# Patient Record
Sex: Male | Born: 1955 | Race: White | Hispanic: No
Health system: Southern US, Community
[De-identification: ages and names within clinical notes are randomized; demographics above are authoritative.]

## PROBLEM LIST (undated history)

## (undated) DIAGNOSIS — I4891 Unspecified atrial fibrillation: Secondary | ICD-10-CM

## (undated) DIAGNOSIS — J9621 Acute and chronic respiratory failure with hypoxia: Secondary | ICD-10-CM

## (undated) DIAGNOSIS — I214 Non-ST elevation (NSTEMI) myocardial infarction: Secondary | ICD-10-CM

## (undated) DIAGNOSIS — N186 End stage renal disease: Secondary | ICD-10-CM

## (undated) DIAGNOSIS — I469 Cardiac arrest, cause unspecified: Secondary | ICD-10-CM

---

## 2020-10-31 ENCOUNTER — Institutional Professional Consult (permissible substitution)
Admission: RE | Admit: 2020-10-31 | Discharge: 2020-11-05 | Disposition: A | Discharge status: Still In Hospital | Payer: Medicare Other | Attending: Internal Medicine | Admitting: Internal Medicine

## 2020-10-31 ENCOUNTER — Other Ambulatory Visit (HOSPITAL_COMMUNITY): Payer: Medicare Other

## 2020-10-31 DIAGNOSIS — I4891 Unspecified atrial fibrillation: Secondary | ICD-10-CM | POA: Diagnosis present

## 2020-10-31 DIAGNOSIS — I469 Cardiac arrest, cause unspecified: Secondary | ICD-10-CM | POA: Diagnosis present

## 2020-10-31 DIAGNOSIS — N186 End stage renal disease: Secondary | ICD-10-CM

## 2020-10-31 DIAGNOSIS — Z931 Gastrostomy status: Secondary | ICD-10-CM

## 2020-10-31 DIAGNOSIS — J9690 Respiratory failure, unspecified, unspecified whether with hypoxia or hypercapnia: Secondary | ICD-10-CM

## 2020-10-31 DIAGNOSIS — I214 Non-ST elevation (NSTEMI) myocardial infarction: Secondary | ICD-10-CM | POA: Diagnosis present

## 2020-10-31 DIAGNOSIS — J9621 Acute and chronic respiratory failure with hypoxia: Secondary | ICD-10-CM | POA: Diagnosis present

## 2020-10-31 DIAGNOSIS — R112 Nausea with vomiting, unspecified: Secondary | ICD-10-CM

## 2020-10-31 HISTORY — DX: Cardiac arrest, cause unspecified: I46.9

## 2020-10-31 HISTORY — DX: Non-ST elevation (NSTEMI) myocardial infarction: I21.4

## 2020-10-31 HISTORY — DX: Acute and chronic respiratory failure with hypoxia: J96.21

## 2020-10-31 HISTORY — DX: Unspecified atrial fibrillation: I48.91

## 2020-10-31 HISTORY — DX: End stage renal disease: N18.6

## 2020-10-31 LAB — CBC
HCT: 28.7 % — ABNORMAL LOW (ref 39.0–52.0)
Hemoglobin: 8.5 g/dL — ABNORMAL LOW (ref 13.0–17.0)
MCH: 29.5 pg (ref 26.0–34.0)
MCHC: 29.6 g/dL — ABNORMAL LOW (ref 30.0–36.0)
MCV: 99.7 fL (ref 80.0–100.0)
Platelets: 174 10*3/uL (ref 150–400)
RBC: 2.88 MIL/uL — ABNORMAL LOW (ref 4.22–5.81)
RDW: 20.6 % — ABNORMAL HIGH (ref 11.5–15.5)
WBC: 4.6 10*3/uL (ref 4.0–10.5)
nRBC: 0 % (ref 0.0–0.2)

## 2020-10-31 LAB — BLOOD GAS, ARTERIAL
Acid-Base Excess: 3.4 mmol/L — ABNORMAL HIGH (ref 0.0–2.0)
Bicarbonate: 27.9 mmol/L (ref 20.0–28.0)
FIO2: 30
O2 Saturation: 98.6 %
Patient temperature: 36.6
pCO2 arterial: 45.7 mmHg (ref 32.0–48.0)
pH, Arterial: 7.401 (ref 7.350–7.450)
pO2, Arterial: 107 mmHg (ref 83.0–108.0)

## 2020-10-31 LAB — PROTIME-INR
INR: 1.2 (ref 0.8–1.2)
Prothrombin Time: 14.5 seconds (ref 11.4–15.2)

## 2020-10-31 LAB — APTT: aPTT: 49 seconds — ABNORMAL HIGH (ref 24–36)

## 2020-10-31 MED ORDER — IOHEXOL 350 MG/ML SOLN: 50.0000 mL | Freq: Once | INTRAVENOUS | Status: DC | PRN

## 2020-11-01 ENCOUNTER — Encounter: Payer: Self-pay | Admitting: Internal Medicine

## 2020-11-01 DIAGNOSIS — I469 Cardiac arrest, cause unspecified: Secondary | ICD-10-CM | POA: Diagnosis not present

## 2020-11-01 DIAGNOSIS — I4891 Unspecified atrial fibrillation: Secondary | ICD-10-CM | POA: Diagnosis present

## 2020-11-01 DIAGNOSIS — J9621 Acute and chronic respiratory failure with hypoxia: Secondary | ICD-10-CM | POA: Diagnosis present

## 2020-11-01 DIAGNOSIS — Z992 Dependence on renal dialysis: Secondary | ICD-10-CM

## 2020-11-01 DIAGNOSIS — I214 Non-ST elevation (NSTEMI) myocardial infarction: Secondary | ICD-10-CM

## 2020-11-01 DIAGNOSIS — N186 End stage renal disease: Secondary | ICD-10-CM | POA: Diagnosis not present

## 2020-11-01 LAB — PROTIME-INR
INR: 1.3 — ABNORMAL HIGH (ref 0.8–1.2)
Prothrombin Time: 15.2 seconds (ref 11.4–15.2)

## 2020-11-01 LAB — COMPREHENSIVE METABOLIC PANEL
ALT: 17 U/L (ref 0–44)
AST: 18 U/L (ref 15–41)
Albumin: 2.6 g/dL — ABNORMAL LOW (ref 3.5–5.0)
Alkaline Phosphatase: 581 U/L — ABNORMAL HIGH (ref 38–126)
Anion gap: 13 (ref 5–15)
BUN: 25 mg/dL — ABNORMAL HIGH (ref 8–23)
CO2: 28 mmol/L (ref 22–32)
Calcium: 8.9 mg/dL (ref 8.9–10.3)
Chloride: 96 mmol/L — ABNORMAL LOW (ref 98–111)
Creatinine, Ser: 3.29 mg/dL — ABNORMAL HIGH (ref 0.61–1.24)
GFR, Estimated: 20 mL/min — ABNORMAL LOW (ref >60)
Glucose, Bld: 148 mg/dL — ABNORMAL HIGH (ref 70–99)
Potassium: 4.3 mmol/L (ref 3.5–5.1)
Sodium: 137 mmol/L (ref 135–145)
Total Bilirubin: 1.2 mg/dL (ref 0.3–1.2)
Total Protein: 5.5 g/dL — ABNORMAL LOW (ref 6.5–8.1)

## 2020-11-01 LAB — CBC WITH DIFFERENTIAL/PLATELET
Abs Immature Granulocytes: 0.06 10*3/uL (ref 0.00–0.07)
Basophils Absolute: 0 10*3/uL (ref 0.0–0.1)
Basophils Relative: 1 %
Eosinophils Absolute: 0.5 10*3/uL (ref 0.0–0.5)
Eosinophils Relative: 9 %
HCT: 27.8 % — ABNORMAL LOW (ref 39.0–52.0)
Hemoglobin: 8.1 g/dL — ABNORMAL LOW (ref 13.0–17.0)
Immature Granulocytes: 1 %
Lymphocytes Relative: 22 %
Lymphs Abs: 1.2 10*3/uL (ref 0.7–4.0)
MCH: 29.6 pg (ref 26.0–34.0)
MCHC: 29.1 g/dL — ABNORMAL LOW (ref 30.0–36.0)
MCV: 101.5 fL — ABNORMAL HIGH (ref 80.0–100.0)
Monocytes Absolute: 0.5 10*3/uL (ref 0.1–1.0)
Monocytes Relative: 10 %
Neutro Abs: 3.1 10*3/uL (ref 1.7–7.7)
Neutrophils Relative %: 57 %
Platelets: 190 10*3/uL (ref 150–400)
RBC: 2.74 MIL/uL — ABNORMAL LOW (ref 4.22–5.81)
RDW: 20.8 % — ABNORMAL HIGH (ref 11.5–15.5)
WBC: 5.4 10*3/uL (ref 4.0–10.5)
nRBC: 0 % (ref 0.0–0.2)

## 2020-11-01 LAB — APTT: aPTT: 67 seconds — ABNORMAL HIGH (ref 24–36)

## 2020-11-01 LAB — PHOSPHORUS: Phosphorus: 3 mg/dL (ref 2.5–4.6)

## 2020-11-01 LAB — HEPARIN LEVEL (UNFRACTIONATED)
Heparin Unfractionated: 0.14 IU/mL — ABNORMAL LOW (ref 0.30–0.70)
Heparin Unfractionated: 0.11 IU/mL — ABNORMAL LOW (ref 0.30–0.70)

## 2020-11-01 LAB — MAGNESIUM: Magnesium: 2.2 mg/dL (ref 1.7–2.4)

## 2020-11-01 NOTE — Consult Note (Signed)
Pulmonary [place]  Date of Service: 11/01/2020  PULMONARY CRITICAL CARE CONSULT   Eric Spence  WNU:[id number]  DOB: 1956-09-06   DOA: 10/31/2020  Referring Physician: Merton Border, MD  HPI: Eric Spence is a 65 y.o. male seen for follow up of Acute on Chronic Respiratory Failure.  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  Patient was associated with having shortness of breath along with this.  The patient was evaluated in the emergency department and admitted to the hospital.  He has had a previous evaluation of cardiac catheterization in 2012 stress test in 2020.  Patient was noted to be volume overloaded with orthopnea and dyspnea.  The patient was started on dialysis.  Apparently had a follow-up stress test done and had been having some generalized weakness.  There was also some diarrhea noted during the stay.  On the 29th patient did suffer to cardiac arrest requiring CPR and they were able to achieve return of circulation.  Patient was admitted to the ICU at that point.  Patient was intubated on the ventilator requiring oxygen at 80%.  His oxygen saturations did not really show much improvement over the day.  Patient subsequently was treated with meropenem and oral vancomycin because he was felt to have significant pneumonia.  Also had low blood pressures requiring pressors.  Patient required ongoing sedation.  Presents to our hospital now for further management and weaning he had a protracted prolonged course on mechanical ventilation so had to have a tracheostomy done.  Review of Systems:  ROS performed and is unremarkable other than noted above.   Past Medical History:  Diagnosis Date  . Abnormal finding on  EKG 12/08/2018  A FIB, RTWARD AXIS, ABN ORS-T ANGLE, CONSIDER PRIMARY T WAVE ABN  . Atrial fibrillation (CMS-HCC)  . CHF (congestive heart failure) (CMS-HCC)  . Chronic kidney disease  . Coronary artery disease  . Decubitus ulcer of buttock  . Diabetes mellitus (CMS-HCC)  . H/O echocardiogram 11/12/18 EF 45-50%  . Hypertension  . Iron deficiency anemia 02/11/2019  . Myocardial infarction (CMS-HCC)  . Shortness of breath  02 3L [place] ALL TIME  . Ulcer of left foot (CMS-HCC)   Surgical History   Past Surgical History:  Procedure Laterality Date  . CARDIAC CATHETERIZATION  . HERNIA REPAIR  . PR ANASTOMOSIS,AV,ANY SITE Left 02/13/2019  Procedure: Creation of a left forearm arteriovenous fistula; Surgeon: Anastasio Auerbach, MD; Location: [facility]; Service: General Surgery  . PR INSERT TUNNELED CV CATH W/O PORT OR PUMP N/A 12/10/2018  Procedure: Placement of hemodialysis catheter.; Surgeon: Anastasio Auerbach, MD; Location: [facility]; Service: General Surgery  . PR UPPER GI ENDOSCOPY,DIAGNOSIS N/A 02/08/2019  Procedure: UGI ENDO, INCLUDE ESOPHAGUS, STOMACH, & DUODENUM &/OR JEJUNUM; DX W/WO COLLECTION SPECIMN, BY [facility] OR [place]; Surgeon: Annie Main, MD; Location: [facility]; Service: General Surgery   Social History   Social History   Social History Narrative  . Not on file   Social History   Tobacco Use  . Smoking status: Current Every Day Smoker  Packs/day: 0.25  Types: Cigarettes  . Smokeless tobacco: Never Used  . Tobacco comment: 1-2 cigarettes a day  Substance Use Topics  . Alcohol use: Never  . Drug use: Never  Family History  No family history on file.  No family status information on file.       Medications: Reviewed on Rounds  Physical Exam:  Vitals: Temperature is 97.2 pulse 98 respiratory 21 blood pressure is 102/65 saturations 100%  Ventilator Settings on assist control FiO2 30% PEEP 5 tidal volume 500  . General: Comfortable at this time . Eyes: Grossly  normal lids, irises & conjunctiva . ENT: grossly tongue is normal . Neck: no obvious mass . Cardiovascular: S1-S2 normal no gallop or rub . Respiratory: Coarse rhonchi expansion is equal . Abdomen: Soft and nontender . Skin: no rash seen on limited exam . Musculoskeletal: not rigid . Psychiatric:unable to assess . Neurologic: no seizure no involuntary movements         Labs on Admission:  Basic Metabolic Panel: Recent Labs  Lab 11/01/20 0413  NA 137  K 4.3  CL 96*  CO2 28  GLUCOSE 148*  BUN 25*  CREATININE 3.29*  CALCIUM 8.9  MG 2.2  PHOS 3.0    Recent Labs  Lab 10/31/20 1845  PHART 7.401  PCO2ART 45.7  PO2ART 107  HCO3 27.9  O2SAT 98.6    Liver Function Tests: Recent Labs  Lab 11/01/20 0413  AST 18  ALT 17  ALKPHOS 581*  BILITOT 1.2  PROT 5.5*  ALBUMIN 2.6*   No results for input(s): LIPASE, AMYLASE in the last 168 hours. No results for input(s): AMMONIA in the last 168 hours.  CBC: Recent Labs  Lab 10/31/20 1917 11/01/20 0413  WBC 4.6 5.4  NEUTROABS  --  3.1  HGB 8.5* 8.1*  HCT 28.7* 27.8*  MCV 99.7 101.5*  PLT 174 190    Cardiac Enzymes: No results for input(s): CKTOTAL, CKMB, CKMBINDEX, TROPONINI in the last 168 hours.  BNP (last 3 results) No results for input(s): BNP in the last 8760 hours.  ProBNP (last 3 results) No results for input(s): PROBNP in the last 8760 hours.   Radiological Exams on Admission: DG ABDOMEN PEG TUBE LOCATION  Result Date: 10/31/2020 CLINICAL DATA:  65 year old male with respiratory failure. Gastrostomy. EXAM: PORTABLE CHEST 1 VIEW COMPARISON:  None. FINDINGS: Tracheostomy with tip approximately 8 cm above the carina. Left IJ central venous line with tip close to the cavoatrial junction. Patchy right lung base atelectasis or infiltrate. A small right pleural effusion may be present. No pneumothorax. Mild cardiomegaly. Percutaneous gastrostomy with tip over the proximal body of the stomach. Contrast injected  through the tube noted in the duodenum. No extraluminal contrast identified. Diffuse osseous sclerotic lesions most suggestive of metastatic disease. Clinical correlation is recommended. IMPRESSION: 1. Patchy right lung base atelectasis or infiltrate. Possible small right pleural effusion. 2. Percutaneous gastrostomy with tip over the proximal body of the stomach. Electronically Signed   By: Anner Crete M.D.   On: 10/31/2020 21:25   DG Chest Port 1 View  Result Date: 10/31/2020 CLINICAL DATA:  65 year old male with respiratory failure. Gastrostomy. EXAM: PORTABLE CHEST 1 VIEW COMPARISON:  None. FINDINGS: Tracheostomy with tip approximately 8 cm above the carina. Left IJ central venous line with tip close to the cavoatrial junction. Patchy right lung base atelectasis or infiltrate. A small right pleural effusion may be present. No pneumothorax. Mild cardiomegaly. Percutaneous gastrostomy with tip over the proximal body of the stomach. Contrast injected through the tube noted in the duodenum. No extraluminal contrast identified. Diffuse osseous sclerotic lesions most suggestive of metastatic disease. Clinical correlation is recommended. IMPRESSION: 1. Patchy right lung  base atelectasis or infiltrate. Possible small right pleural effusion. 2. Percutaneous gastrostomy with tip over the proximal body of the stomach. Electronically Signed   By: Anner Crete M.D.   On: 10/31/2020 21:25    Assessment/Plan Active Problems:   Acute on chronic respiratory failure with hypoxia (HCC)   Atrial fibrillation with rapid ventricular response (HCC)   End stage renal failure on dialysis [facility])   Cardiac arrest ([place])   Non-STEMI (non-ST elevated myocardial infarction) ([place])   1. Acute on chronic respiratory failure hypoxia [place]auer is on assist control mode on 30% FiO2 with a PEEP of 5 respiratory therapy will check the RSB I mechanics and try to wean. 2. Atrial fibrillation rapid ventricular response patient  is now rate controlled has been on amiodarone we will continue with medical management. 3. End-stage renal failure on hemodialysis will need follow-up with nephrology for hemodialysis. 4. Cardiac arrest patient suffered multiple cardiac arrest at the other facility the plan is going to be to continue to monitor on telemetry. 5. Non-STEMI supportive care will cardiology involvement may be considered  I have personally seen and evaluated the patient, evaluated laboratory and imaging results, formulated the assessment and plan and placed orders. The Patient requires high complexity decision making with multiple systems involvement.  Case was discussed on Rounds with the Respiratory Therapy Director and the Respiratory staff Time Spent 32minutes  Saadat A Khan, MD [facility] Pulmonary Critical Care Medicine Sleep Medicine

## 2020-11-02 DIAGNOSIS — I469 Cardiac arrest, cause unspecified: Secondary | ICD-10-CM | POA: Diagnosis not present

## 2020-11-02 DIAGNOSIS — N186 End stage renal disease: Secondary | ICD-10-CM | POA: Diagnosis not present

## 2020-11-02 DIAGNOSIS — I4891 Unspecified atrial fibrillation: Secondary | ICD-10-CM | POA: Diagnosis not present

## 2020-11-02 DIAGNOSIS — J9621 Acute and chronic respiratory failure with hypoxia: Secondary | ICD-10-CM | POA: Diagnosis not present

## 2020-11-02 LAB — HEPATITIS B CORE ANTIBODY, TOTAL: Hep B Core Total Ab: NONREACTIVE

## 2020-11-02 LAB — HEPARIN LEVEL (UNFRACTIONATED)
Heparin Unfractionated: 0.28 IU/mL — ABNORMAL LOW (ref 0.30–0.70)
Heparin Unfractionated: <0.1 IU/mL — ABNORMAL LOW (ref 0.30–0.70)
Heparin Unfractionated: 0.39 IU/mL (ref 0.30–0.70)

## 2020-11-02 LAB — HEPATITIS B SURFACE ANTIGEN: Hepatitis B Surface Ag: NONREACTIVE

## 2020-11-02 NOTE — Progress Notes (Signed)
Pulmonary Critical Care Medicine [place]   PULMONARY CRITICAL CARE SERVICE  PROGRESS NOTE  Date of Service: 11/02/2020  Eric Spence  IOE:[id number]  DOB: 1956/04/09   DOA: 10/31/2020  Referring Physician: Merton Border, MD  HPI: Eric Spence is a 65 y.o. male seen for follow up of Acute on Chronic Respiratory Failure.  Patient currently is on full support on the ventilator.  Supposed to do pressure support weaning today.  Medications: Reviewed on Rounds  Physical Exam:  Vitals: Temperature is 97.8 pulse 170 respiratory 25 blood pressure is 123/64 saturations 97%  Ventilator Settings currently on assist control FiO2 is 30% tidal volume 500 PEEP of 5  . General: Comfortable at this time . Eyes: Grossly normal lids, irises & conjunctiva . ENT: grossly tongue is normal . Neck: no obvious mass . Cardiovascular: S1 S2 normal no gallop . Respiratory: Scattered rhonchi expansion is equal . Abdomen: soft . Skin: no rash seen on limited exam . Musculoskeletal: not rigid . Psychiatric:unable to assess . Neurologic: no seizure no involuntary movements         Lab Data:   Basic Metabolic Panel: Recent Labs  Lab 11/01/20 0413  NA 137  K 4.3  CL 96*  CO2 28  GLUCOSE 148*  BUN 25*  CREATININE 3.29*  CALCIUM 8.9  MG 2.2  PHOS 3.0    ABG: Recent Labs  Lab 10/31/20 1845  PHART 7.401  PCO2ART 45.7  PO2ART 107  HCO3 27.9  O2SAT 98.6    Liver Function Tests: Recent Labs  Lab 11/01/20 0413  AST 18  ALT 17  ALKPHOS 581*  BILITOT 1.2  PROT 5.5*  ALBUMIN 2.6*   No results for input(s): LIPASE, AMYLASE in the last 168 hours. No results for input(s): AMMONIA in the last 168 hours.  CBC: Recent Labs  Lab 10/31/20 1917 11/01/20 0413  WBC 4.6 5.4  NEUTROABS  --  3.1  HGB 8.5* 8.1*  HCT 28.7* 27.8*  MCV 99.7 101.5*  PLT 174 190    Cardiac Enzymes: No results for input(s): CKTOTAL, CKMB, CKMBINDEX, TROPONINI in the last 168  hours.  BNP (last 3 results) No results for input(s): BNP in the last 8760 hours.  ProBNP (last 3 results) No results for input(s): PROBNP in the last 8760 hours.  Radiological Exams: DG ABDOMEN PEG TUBE LOCATION  Result Date: 10/31/2020 CLINICAL DATA:  65 year old male with respiratory failure. Gastrostomy. EXAM: PORTABLE CHEST 1 VIEW COMPARISON:  None. FINDINGS: Tracheostomy with tip approximately 8 cm above the carina. Left IJ central venous line with tip close to the cavoatrial junction. Patchy right lung base atelectasis or infiltrate. A small right pleural effusion may be present. No pneumothorax. Mild cardiomegaly. Percutaneous gastrostomy with tip over the proximal body of the stomach. Contrast injected through the tube noted in the duodenum. No extraluminal contrast identified. Diffuse osseous sclerotic lesions most suggestive of metastatic disease. Clinical correlation is recommended. IMPRESSION: 1. Patchy right lung base atelectasis or infiltrate. Possible small right pleural effusion. 2. Percutaneous gastrostomy with tip over the proximal body of the stomach. Electronically Signed   By: Anner Crete M.D.   On: 10/31/2020 21:25   DG Chest Port 1 View  Result Date: 10/31/2020 CLINICAL DATA:  65 year old male with respiratory failure. Gastrostomy. EXAM: PORTABLE CHEST 1 VIEW COMPARISON:  None. FINDINGS: Tracheostomy with tip approximately 8 cm above the carina. Left IJ central venous line with tip close to the cavoatrial junction. Patchy right lung base atelectasis  or infiltrate. A small right pleural effusion may be present. No pneumothorax. Mild cardiomegaly. Percutaneous gastrostomy with tip over the proximal body of the stomach. Contrast injected through the tube noted in the duodenum. No extraluminal contrast identified. Diffuse osseous sclerotic lesions most suggestive of metastatic disease. Clinical correlation is recommended. IMPRESSION: 1. Patchy right lung base atelectasis or  infiltrate. Possible small right pleural effusion. 2. Percutaneous gastrostomy with tip over the proximal body of the stomach. Electronically Signed   By: Anner Crete M.D.   On: 10/31/2020 21:25    Assessment/Plan Active Problems:   Acute on chronic respiratory failure with hypoxia (HCC)   Atrial fibrillation with rapid ventricular response (HCC)   End stage renal failure on dialysis [facility])   Cardiac arrest ([place])   Non-STEMI (non-ST elevated myocardial infarction) ([place])   1. Acute on chronic respiratory failure hypoxia patient is on full support right now to goal on pressure support 2. Atrial fibrillation rate is controlled 3. End-stage renal failure on dialysis 4. Cardiac arrest rhythm stable 5. Non-STEMI no change we will continue to monitor   I have personally seen and evaluated the patient, evaluated laboratory and imaging results, formulated the assessment and plan and placed orders. The Patient requires high complexity decision making with multiple systems involvement.  Rounds were done with the Respiratory Therapy Director and Staff therapists and discussed with nursing staff also.  [name], MD [facility] Pulmonary Critical Care Medicine Sleep Medicine

## 2020-11-03 DIAGNOSIS — I4891 Unspecified atrial fibrillation: Secondary | ICD-10-CM | POA: Diagnosis not present

## 2020-11-03 DIAGNOSIS — N186 End stage renal disease: Secondary | ICD-10-CM | POA: Diagnosis not present

## 2020-11-03 DIAGNOSIS — J9621 Acute and chronic respiratory failure with hypoxia: Secondary | ICD-10-CM | POA: Diagnosis not present

## 2020-11-03 DIAGNOSIS — I469 Cardiac arrest, cause unspecified: Secondary | ICD-10-CM | POA: Diagnosis not present

## 2020-11-03 LAB — HEPARIN LEVEL (UNFRACTIONATED)
Heparin Unfractionated: 0.11 IU/mL — ABNORMAL LOW (ref 0.30–0.70)
Heparin Unfractionated: 0.27 IU/mL — ABNORMAL LOW (ref 0.30–0.70)
Heparin Unfractionated: 0.23 IU/mL — ABNORMAL LOW (ref 0.30–0.70)

## 2020-11-03 LAB — HEPATITIS B SURFACE ANTIBODY, QUANTITATIVE: Hep B S AB Quant (Post): 5.1 m[IU]/mL — ABNORMAL LOW (ref >9.9)

## 2020-11-03 NOTE — Progress Notes (Signed)
Pulmonary Critical Care Medicine [place]   PULMONARY CRITICAL CARE SERVICE  PROGRESS NOTE  Date of Service: 11/03/2020  Eric Spence  GYJ:[id number]  DOB: 1956/09/01   DOA: 10/31/2020  Referring Physician: Merton Border, MD  HPI: Eric Spence is a 64 y.o. male seen for follow up of Acute on Chronic Respiratory Failure.  Patient currently is on pressure support has been on pressure of 12/5  Medications: Reviewed on Rounds  Physical Exam:  Vitals: Temperature is 98.9 pulse 104 respiratory rate is 20 blood pressure is 144/74 saturations 98%  Ventilator Settings on pressure support currently on 12/5  . General: Comfortable at this time . Eyes: Grossly normal lids, irises & conjunctiva . ENT: grossly tongue is normal . Neck: no obvious mass . Cardiovascular: S1 S2 normal no gallop . Respiratory: No rhonchi very coarse percent . Abdomen: soft . Skin: no rash seen on limited exam . Musculoskeletal: not rigid . Psychiatric:unable to assess . Neurologic: no seizure no involuntary movements         Lab Data:   Basic Metabolic Panel: Recent Labs  Lab 11/01/20 0413  NA 137  K 4.3  CL 96*  CO2 28  GLUCOSE 148*  BUN 25*  CREATININE 3.29*  CALCIUM 8.9  MG 2.2  PHOS 3.0    ABG: Recent Labs  Lab 10/31/20 1845  PHART 7.401  PCO2ART 45.7  PO2ART 107  HCO3 27.9  O2SAT 98.6    Liver Function Tests: Recent Labs  Lab 11/01/20 0413  AST 18  ALT 17  ALKPHOS 581*  BILITOT 1.2  PROT 5.5*  ALBUMIN 2.6*   No results for input(s): LIPASE, AMYLASE in the last 168 hours. No results for input(s): AMMONIA in the last 168 hours.  CBC: Recent Labs  Lab 10/31/20 1917 11/01/20 0413  WBC 4.6 5.4  NEUTROABS  --  3.1  HGB 8.5* 8.1*  HCT 28.7* 27.8*  MCV 99.7 101.5*  PLT 174 190    Cardiac Enzymes: No results for input(s): CKTOTAL, CKMB, CKMBINDEX, TROPONINI in the last 168 hours.  BNP (last 3 results) No results for input(s): BNP in the  last 8760 hours.  ProBNP (last 3 results) No results for input(s): PROBNP in the last 8760 hours.  Radiological Exams: No results found.  Assessment/Plan Active Problems:   Acute on chronic respiratory failure with hypoxia (HCC)   Atrial fibrillation with rapid ventricular response (HCC)   End stage renal failure on dialysis [facility])   Cardiac arrest ([place])   Non-STEMI (non-ST elevated myocardial infarction) ([place])   1. Acute on chronic respiratory failure hypoxia we will continue with the pressure support wean with goal is for 12 hours today 2. Atrial fibrillation rate is controlled 3. End-stage renal failure on hemodialysis 4. Cardiac arrest rhythm stable 5. Non-STEMI no change   I have personally seen and evaluated the patient, evaluated laboratory and imaging results, formulated the assessment and plan and placed orders. The Patient requires high complexity decision making with multiple systems involvement.  Rounds were done with the Respiratory Therapy Director and Staff therapists and discussed with nursing staff also.  [name], MD [facility] Pulmonary Critical Care Medicine Sleep Medicine

## 2020-11-04 ENCOUNTER — Other Ambulatory Visit (HOSPITAL_COMMUNITY): Payer: Medicare Other

## 2020-11-04 DIAGNOSIS — N186 End stage renal disease: Secondary | ICD-10-CM | POA: Diagnosis not present

## 2020-11-04 DIAGNOSIS — I469 Cardiac arrest, cause unspecified: Secondary | ICD-10-CM | POA: Diagnosis not present

## 2020-11-04 DIAGNOSIS — J9621 Acute and chronic respiratory failure with hypoxia: Secondary | ICD-10-CM | POA: Diagnosis not present

## 2020-11-04 DIAGNOSIS — I4891 Unspecified atrial fibrillation: Secondary | ICD-10-CM | POA: Diagnosis not present

## 2020-11-04 LAB — RENAL FUNCTION PANEL
Albumin: 2.4 g/dL — ABNORMAL LOW (ref 3.5–5.0)
Anion gap: 11 (ref 5–15)
BUN: 56 mg/dL — ABNORMAL HIGH (ref 8–23)
CO2: 26 mmol/L (ref 22–32)
Calcium: 8.9 mg/dL (ref 8.9–10.3)
Chloride: 99 mmol/L (ref 98–111)
Creatinine, Ser: 3.99 mg/dL — ABNORMAL HIGH (ref 0.61–1.24)
GFR, Estimated: 16 mL/min — ABNORMAL LOW (ref >60)
Glucose, Bld: 136 mg/dL — ABNORMAL HIGH (ref 70–99)
Phosphorus: 2.4 mg/dL — ABNORMAL LOW (ref 2.5–4.6)
Potassium: 3.9 mmol/L (ref 3.5–5.1)
Sodium: 136 mmol/L (ref 135–145)

## 2020-11-04 LAB — CBC
HCT: 26.1 % — ABNORMAL LOW (ref 39.0–52.0)
Hemoglobin: 8 g/dL — ABNORMAL LOW (ref 13.0–17.0)
MCH: 30.8 pg (ref 26.0–34.0)
MCHC: 30.7 g/dL (ref 30.0–36.0)
MCV: 100.4 fL — ABNORMAL HIGH (ref 80.0–100.0)
Platelets: 170 10*3/uL (ref 150–400)
RBC: 2.6 MIL/uL — ABNORMAL LOW (ref 4.22–5.81)
RDW: 20.6 % — ABNORMAL HIGH (ref 11.5–15.5)
WBC: 7.7 10*3/uL (ref 4.0–10.5)
nRBC: 0.3 % — ABNORMAL HIGH (ref 0.0–0.2)

## 2020-11-04 LAB — HEPARIN LEVEL (UNFRACTIONATED)
Heparin Unfractionated: 0.39 IU/mL (ref 0.30–0.70)
Heparin Unfractionated: 0.25 IU/mL — ABNORMAL LOW (ref 0.30–0.70)
Heparin Unfractionated: 0.25 IU/mL — ABNORMAL LOW (ref 0.30–0.70)
Heparin Unfractionated: <0.1 IU/mL — ABNORMAL LOW (ref 0.30–0.70)

## 2020-11-04 NOTE — Consult Note (Signed)
[facility] CONSULT NOTE    Date: 11/04/2020                  Patient Name:  Eric Spence  MRN: [id number]  DOB: 1956/07/25  Age / Sex: 65 y.o., male         PCP: Patient, No Pcp Per                 Service Requesting Consult:  Hospitalist                 Reason for Consult:  Evaluation and management of ESRD            History of Present Illness: Patient is a 65 y.o. male with a PMHx of recent acute cardiopulmonary arrest on 09/27/2020, NSTEMI, severe sepsis from recurrent pneumonia, atrial fibrillation, coronary artery disease with history of MI, diabetes mellitus type 2, anemia of chronic kidney disease, secondary hyperparathyroidism, ESRD with left upper extremity AV fistula, who was admitted to [facility] on 10/31/2020 for ongoing treatment.  He initially presented to outside hospital with complaints of shortness of breath and left-sided chest pain.  He had a stress test performed to further evaluate this.  Subsequent to this on 09/28/2020 patient suffered to cardiopulmonary arrest events.  He has acute on chronic respiratory failure.  Currently has a tracheostomy in place and maintained on the ventilator.  We were consulted for evaluation management of ESRD.  He has a left upper extremity AV fistula in place.  He did undergo hemodialysis treatment on Wednesday.  Patient has associated anemia of chronic kidney disease with a hemoglobin of 8.0.   Medications:  Current medications:  Heparin drip, Retacrit with 10,000 units on Mondays and Fridays, amiodarone 400 mg twice daily, aspirin 81 mg daily, atorvastatin 20 mg daily, diltiazem 60 mg every 8 hours, Fosrenol 750 mg 3 times daily, melatonin 3 mg nightly, Protonix 40 mg daily, scopolamine patch every 72 hours, multivitamin 1 tablet daily   Allergies: Bactrim, codeine, Zithromax, Lipitor   Past Medical History: Past Medical History:  Diagnosis Date  . Acute on chronic respiratory failure with hypoxia ([place])    . Atrial fibrillation with rapid ventricular response ([place])   . Cardiac arrest ([place])   . End stage renal failure on dialysis ([place])   . Non-STEMI (non-ST elevated myocardial infarction) [facility])      Past Surgical History: Left upper extremity AV fistula Tracheostomy placement   Family History: Unable to obtain from the patient as he is currently on the ventilator.  Social History: Unable to obtain from the patient as he is currently on the ventilator.  Review of Systems: Unable to obtain from the patient as he is currently on the ventilator.  Vital Signs: Temperature 98 pulse 102 respirations 20 blood pressure 136/68  Weight trends: There were no vitals filed for this visit.  Physical Exam: Physical Exam: General:  No acute distress  Head:  Normocephalic, atraumatic. Moist oral mucosal membranes  Eyes:  Anicteric  Neck:  Supple  Lungs:   Scattered rhonchi, vent assisted  Heart:  S1S2 irregular  Abdomen:   Soft, nontender, bowel sounds present  Extremities:  1+ peripheral edema.  Neurologic:  Awake, alert, following commands  Skin:  No lesions  Access:  Left upper extremity AV fistula with palpable thrill    Lab results: Basic Metabolic Panel: Recent Labs  Lab 11/01/20 0413 11/04/20 0700  NA 137 136  K 4.3 3.9  CL 96* 99  CO2 28 26  GLUCOSE 148* 136*  BUN 25* 56*  CREATININE 3.29* 3.99*  CALCIUM 8.9 8.9  MG 2.2  --   PHOS 3.0 2.4*    Liver Function Tests: Recent Labs  Lab 11/01/20 0413 11/04/20 0700  AST 18  --   ALT 17  --   ALKPHOS 581*  --   BILITOT 1.2  --   PROT 5.5*  --   ALBUMIN 2.6* 2.4*   No results for input(s): LIPASE, AMYLASE in the last 168 hours. No results for input(s): AMMONIA in the last 168 hours.  CBC: Recent Labs  Lab 10/31/20 1917 11/01/20 0413 11/04/20 0700  WBC 4.6 5.4 7.7  NEUTROABS  --  3.1  --   HGB 8.5* 8.1* 8.0*  HCT 28.7* 27.8* 26.1*  MCV 99.7 101.5* 100.4*  PLT 174 190 170    Cardiac Enzymes: No  results for input(s): CKTOTAL, CKMB, CKMBINDEX, TROPONINI in the last 168 hours.  BNP: Invalid input(s): POCBNP  CBG: No results for input(s): GLUCAP in the last 168 hours.  Microbiology: No results found for this or any previous visit.  Coagulation Studies: No results for input(s): LABPROT, INR in the last 72 hours.  Urinalysis: No results for input(s): COLORURINE, LABSPEC, PHURINE, GLUCOSEU, HGBUR, BILIRUBINUR, KETONESUR, PROTEINUR, UROBILINOGEN, NITRITE, LEUKOCYTESUR in the last 72 hours.  Invalid input(s): APPERANCEUR    Imaging:  No results found.   Assessment & Plan: Pt is a 65 y.o. 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  1.  ESRD on HD MWF.  Patient due for hemodialysis treatment today.  We will prepare orders.  Use left upper extremity fistula for dialysis treatment.  2.  Acute respiratory failure.  Still on the ventilator.  Weaning as per pulmonary/critical care.  3.  Anemia of chronic kidney disease.  Maintain the patient on Retacrit 10,000 units IV on Mondays and Fridays.  4.  Secondary hyperparathyroidism.  Currently on Fosrenol.  Continue to monitor phosphorus and calcium periodically.  5.  Thanks of consultation.

## 2020-11-04 NOTE — Progress Notes (Signed)
Pulmonary Critical Care Medicine [place]   PULMONARY CRITICAL CARE SERVICE  PROGRESS NOTE  Date of Service: 11/04/2020  Eric Spence  VZC:[id number]  DOB: 1955-12-02   DOA: 10/31/2020  Referring Physician: Merton Border, MD  HPI: Eric Spence is a 65 y.o. male seen for follow up of Acute on Chronic Respiratory Failure.  Patient currently is on pressure support has been on 30% FiO2 currently goal of 16 hours  Medications: Reviewed on Rounds  Physical Exam:  Vitals: Temp 98.0 pulse 102 respiratory rate is 28 blood pressure is 136/68 saturations 99%  Ventilator Settings on pressure support 12/5  . General: Comfortable at this time . Eyes: Grossly normal lids, irises & conjunctiva . ENT: grossly tongue is normal . Neck: no obvious mass . Cardiovascular: S1 S2 normal no gallop . Respiratory: Scattered rhonchi expansion equal . Abdomen: soft . Skin: no rash seen on limited exam . Musculoskeletal: not rigid . Psychiatric:unable to assess . Neurologic: no seizure no involuntary movements         Lab Data:   Basic Metabolic Panel: Recent Labs  Lab 11/01/20 0413 11/04/20 0700  NA 137 136  K 4.3 3.9  CL 96* 99  CO2 28 26  GLUCOSE 148* 136*  BUN 25* 56*  CREATININE 3.29* 3.99*  CALCIUM 8.9 8.9  MG 2.2  --   PHOS 3.0 2.4*    ABG: Recent Labs  Lab 10/31/20 1845  PHART 7.401  PCO2ART 45.7  PO2ART 107  HCO3 27.9  O2SAT 98.6    Liver Function Tests: Recent Labs  Lab 11/01/20 0413 11/04/20 0700  AST 18  --   ALT 17  --   ALKPHOS 581*  --   BILITOT 1.2  --   PROT 5.5*  --   ALBUMIN 2.6* 2.4*   No results for input(s): LIPASE, AMYLASE in the last 168 hours. No results for input(s): AMMONIA in the last 168 hours.  CBC: Recent Labs  Lab 10/31/20 1917 11/01/20 0413 11/04/20 0700  WBC 4.6 5.4 7.7  NEUTROABS  --  3.1  --   HGB 8.5* 8.1* 8.0*  HCT 28.7* 27.8* 26.1*  MCV 99.7 101.5* 100.4*  PLT 174 190 170    Cardiac  Enzymes: No results for input(s): CKTOTAL, CKMB, CKMBINDEX, TROPONINI in the last 168 hours.  BNP (last 3 results) No results for input(s): BNP in the last 8760 hours.  ProBNP (last 3 results) No results for input(s): PROBNP in the last 8760 hours.  Radiological Exams: No results found.  Assessment/Plan Active Problems:   Acute on chronic respiratory failure with hypoxia (HCC)   Atrial fibrillation with rapid ventricular response (HCC)   End stage renal failure on dialysis [facility])   Cardiac arrest ([place])   Non-STEMI (non-ST elevated myocardial infarction) ([place])   1. Acute on chronic respiratory failure hypoxia we will continue with the wean on pressure support goal of 16 hours 2. Atrial fibrillation rate controlled 3. End-stage renal failure on dialysis 4. Cardiac arrest rhythm stable 5. Non-STEMI no change we will continue to monitor closely   I have personally seen and evaluated the patient, evaluated laboratory and imaging results, formulated the assessment and plan and placed orders. The Patient requires high complexity decision making with multiple systems involvement.  Rounds were done with the Respiratory Therapy Director and Staff therapists and discussed with nursing staff also.  [name], MD [facility] Pulmonary Critical Care Medicine Sleep Medicine

## 2020-11-05 ENCOUNTER — Other Ambulatory Visit (HOSPITAL_COMMUNITY): Payer: Medicare Other

## 2020-11-05 DIAGNOSIS — N186 End stage renal disease: Secondary | ICD-10-CM | POA: Diagnosis not present

## 2020-11-05 DIAGNOSIS — J9621 Acute and chronic respiratory failure with hypoxia: Secondary | ICD-10-CM | POA: Diagnosis not present

## 2020-11-05 DIAGNOSIS — I469 Cardiac arrest, cause unspecified: Secondary | ICD-10-CM | POA: Diagnosis not present

## 2020-11-05 DIAGNOSIS — I4891 Unspecified atrial fibrillation: Secondary | ICD-10-CM | POA: Diagnosis not present

## 2020-11-05 LAB — COMPREHENSIVE METABOLIC PANEL
ALT: 13 U/L (ref 0–44)
AST: 17 U/L (ref 15–41)
Albumin: 2.1 g/dL — ABNORMAL LOW (ref 3.5–5.0)
Alkaline Phosphatase: 461 U/L — ABNORMAL HIGH (ref 38–126)
Anion gap: 12 (ref 5–15)
BUN: 45 mg/dL — ABNORMAL HIGH (ref 8–23)
CO2: 25 mmol/L (ref 22–32)
Calcium: 8.5 mg/dL — ABNORMAL LOW (ref 8.9–10.3)
Chloride: 102 mmol/L (ref 98–111)
Creatinine, Ser: 3 mg/dL — ABNORMAL HIGH (ref 0.61–1.24)
GFR, Estimated: 22 mL/min — ABNORMAL LOW (ref >60)
Glucose, Bld: 159 mg/dL — ABNORMAL HIGH (ref 70–99)
Potassium: 3.7 mmol/L (ref 3.5–5.1)
Sodium: 139 mmol/L (ref 135–145)
Total Bilirubin: 1 mg/dL (ref 0.3–1.2)
Total Protein: 5 g/dL — ABNORMAL LOW (ref 6.5–8.1)

## 2020-11-05 LAB — HEPARIN LEVEL (UNFRACTIONATED): Heparin Unfractionated: 0.59 IU/mL (ref 0.30–0.70)

## 2020-11-05 NOTE — Progress Notes (Signed)
Pulmonary Critical Care Medicine [place]   PULMONARY CRITICAL CARE SERVICE  PROGRESS NOTE  Date of Service: 11/05/2020  AIVEN KAMPE  TIW:[id number]  DOB: 03-25-1956   DOA: 10/31/2020  Referring Physician: Merton Border, MD  HPI: Eric Spence is a 65 y.o. male seen for follow up of Acute on Chronic Respiratory Failure.  Patient currently is on the ventilator and full support had some issues with vomiting overnight  Medications: Reviewed on Rounds  Physical Exam:  Vitals: Temperature is 98.4 pulse 120 respiratory 34 blood pressure is 147/67 saturations 96%  Ventilator Settings on assist control FiO2 is 30% tidal volume 500 with a PEEP of 5  . General: Comfortable at this time . Eyes: Grossly normal lids, irises & conjunctiva . ENT: grossly tongue is normal . Neck: no obvious mass . Cardiovascular: S1 S2 normal no gallop . Respiratory: No rhonchi coarse breath sound . Abdomen: soft . Skin: no rash seen on limited exam . Musculoskeletal: not rigid . Psychiatric:unable to assess . Neurologic: no seizure no involuntary movements         Lab Data:   Basic Metabolic Panel: Recent Labs  Lab 11/01/20 0413 11/04/20 0700  NA 137 136  K 4.3 3.9  CL 96* 99  CO2 28 26  GLUCOSE 148* 136*  BUN 25* 56*  CREATININE 3.29* 3.99*  CALCIUM 8.9 8.9  MG 2.2  --   PHOS 3.0 2.4*    ABG: Recent Labs  Lab 10/31/20 1845  PHART 7.401  PCO2ART 45.7  PO2ART 107  HCO3 27.9  O2SAT 98.6    Liver Function Tests: Recent Labs  Lab 11/01/20 0413 11/04/20 0700  AST 18  --   ALT 17  --   ALKPHOS 581*  --   BILITOT 1.2  --   PROT 5.5*  --   ALBUMIN 2.6* 2.4*   No results for input(s): LIPASE, AMYLASE in the last 168 hours. No results for input(s): AMMONIA in the last 168 hours.  CBC: Recent Labs  Lab 10/31/20 1917 11/01/20 0413 11/04/20 0700  WBC 4.6 5.4 7.7  NEUTROABS  --  3.1  --   HGB 8.5* 8.1* 8.0*  HCT 28.7* 27.8* 26.1*  MCV 99.7 101.5*  100.4*  PLT 174 190 170    Cardiac Enzymes: No results for input(s): CKTOTAL, CKMB, CKMBINDEX, TROPONINI in the last 168 hours.  BNP (last 3 results) No results for input(s): BNP in the last 8760 hours.  ProBNP (last 3 results) No results for input(s): PROBNP in the last 8760 hours.  Radiological Exams: DG Abd 1 View  Result Date: 11/04/2020 CLINICAL DATA:  Nausea and vomiting EXAM: ABDOMEN - 1 VIEW COMPARISON:  10/31/2020 FINDINGS: Two supine frontal views of the abdomen and pelvis are obtained, limited by portable technique and body habitus. There is diffuse gaseous distension of small bowel, nonspecific. Oral contrast is seen throughout the colon to the level of the rectum. There are no masses or abnormal calcifications identified. Diffuse sclerosis of the visualized bony structures most pronounced within the spine, pelvis, and bilateral hips, stable. 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 IMPRESSION: 1. Nonspecific gaseous distention of the small bowel, which could reflect obstruction or ileus. Electronically Signed   By: Randa Ngo M.D.   On: 11/04/2020 16:55    Assessment/Plan Active Problems:   Acute on chronic respiratory failure with hypoxia (HCC)   Atrial fibrillation with rapid ventricular  response ([place])   End stage renal failure on dialysis [facility])   Cardiac arrest ([place])   Non-STEMI (non-ST elevated myocardial infarction) ([place])   1. Acute on chronic respiratory failure hypoxia we will continue with assist control titrate oxygen continue pulmonary toilet. 2. Atrial fibrillation rate is controlled at this time 3. End-stage renal failure on hemodialysis 4. Cardiac arrest rhythm stable 5. Non-STEMI no change   I have personally seen and evaluated the patient, evaluated laboratory and imaging results, formulated the assessment and plan and placed orders. The Patient requires high complexity  decision making with multiple systems involvement.  Rounds were done with the Respiratory Therapy Director and Staff therapists and discussed with nursing staff also.  [name], MD [facility] Pulmonary Critical Care Medicine Sleep Medicine

## 2020-11-06 ENCOUNTER — Observation Stay (HOSPITAL_COMMUNITY)
Admission: EM | Admit: 2020-11-06 | Discharge: 2020-11-06 | Disposition: A | Discharge status: Home / Self Care | Payer: No Typology Code available for payment source | Attending: Internal Medicine | Admitting: Internal Medicine

## 2020-11-06 ENCOUNTER — Emergency Department (HOSPITAL_COMMUNITY): Payer: No Typology Code available for payment source

## 2020-11-06 ENCOUNTER — Inpatient Hospital Stay
Admission: RE | Admit: 2020-11-06 | Discharge: 2020-12-30 | Disposition: E | Payer: Medicare Other | Attending: Internal Medicine | Admitting: Internal Medicine

## 2020-11-06 DIAGNOSIS — J189 Pneumonia, unspecified organism: Secondary | ICD-10-CM | POA: Diagnosis not present

## 2020-11-06 DIAGNOSIS — J9610 Chronic respiratory failure, unspecified whether with hypoxia or hypercapnia: Secondary | ICD-10-CM | POA: Diagnosis not present

## 2020-11-06 DIAGNOSIS — E1122 Type 2 diabetes mellitus with diabetic chronic kidney disease: Secondary | ICD-10-CM | POA: Insufficient documentation

## 2020-11-06 DIAGNOSIS — K55059 Acute (reversible) ischemia of intestine, part and extent unspecified: Secondary | ICD-10-CM | POA: Diagnosis present

## 2020-11-06 DIAGNOSIS — I959 Hypotension, unspecified: Principal | ICD-10-CM | POA: Diagnosis present

## 2020-11-06 DIAGNOSIS — N186 End stage renal disease: Secondary | ICD-10-CM

## 2020-11-06 DIAGNOSIS — J9621 Acute and chronic respiratory failure with hypoxia: Secondary | ICD-10-CM | POA: Diagnosis present

## 2020-11-06 DIAGNOSIS — G370 Diffuse sclerosis of central nervous system: Secondary | ICD-10-CM | POA: Diagnosis not present

## 2020-11-06 DIAGNOSIS — Z992 Dependence on renal dialysis: Secondary | ICD-10-CM

## 2020-11-06 DIAGNOSIS — I214 Non-ST elevation (NSTEMI) myocardial infarction: Secondary | ICD-10-CM | POA: Diagnosis present

## 2020-11-06 DIAGNOSIS — Z79899 Other long term (current) drug therapy: Secondary | ICD-10-CM | POA: Insufficient documentation

## 2020-11-06 DIAGNOSIS — I4891 Unspecified atrial fibrillation: Secondary | ICD-10-CM | POA: Insufficient documentation

## 2020-11-06 DIAGNOSIS — I2510 Atherosclerotic heart disease of native coronary artery without angina pectoris: Secondary | ICD-10-CM | POA: Insufficient documentation

## 2020-11-06 DIAGNOSIS — Z20822 Contact with and (suspected) exposure to covid-19: Secondary | ICD-10-CM | POA: Diagnosis not present

## 2020-11-06 DIAGNOSIS — Z794 Long term (current) use of insulin: Secondary | ICD-10-CM | POA: Insufficient documentation

## 2020-11-06 DIAGNOSIS — D649 Anemia, unspecified: Secondary | ICD-10-CM | POA: Diagnosis not present

## 2020-11-06 DIAGNOSIS — J811 Chronic pulmonary edema: Secondary | ICD-10-CM

## 2020-11-06 DIAGNOSIS — I252 Old myocardial infarction: Secondary | ICD-10-CM | POA: Diagnosis not present

## 2020-11-06 DIAGNOSIS — C61 Malignant neoplasm of prostate: Secondary | ICD-10-CM

## 2020-11-06 DIAGNOSIS — Z7982 Long term (current) use of aspirin: Secondary | ICD-10-CM | POA: Diagnosis not present

## 2020-11-06 DIAGNOSIS — I469 Cardiac arrest, cause unspecified: Secondary | ICD-10-CM | POA: Diagnosis present

## 2020-11-06 LAB — CBC WITH DIFFERENTIAL/PLATELET
Abs Immature Granulocytes: 0.01 10*3/uL (ref 0.00–0.07)
Basophils Absolute: 0 10*3/uL (ref 0.0–0.1)
Basophils Relative: 0 %
Eosinophils Absolute: 0 10*3/uL (ref 0.0–0.5)
Eosinophils Relative: 0 %
HCT: 27.9 % — ABNORMAL LOW (ref 39.0–52.0)
Hemoglobin: 7.9 g/dL — ABNORMAL LOW (ref 13.0–17.0)
Immature Granulocytes: 0 %
Lymphocytes Relative: 14 %
Lymphs Abs: 0.8 10*3/uL (ref 0.7–4.0)
MCH: 29.4 pg (ref 26.0–34.0)
MCHC: 28.3 g/dL — ABNORMAL LOW (ref 30.0–36.0)
MCV: 103.7 fL — ABNORMAL HIGH (ref 80.0–100.0)
Monocytes Absolute: 1.1 10*3/uL — ABNORMAL HIGH (ref 0.1–1.0)
Monocytes Relative: 19 %
Neutro Abs: 3.8 10*3/uL (ref 1.7–7.7)
Neutrophils Relative %: 67 %
Platelets: 181 10*3/uL (ref 150–400)
RBC: 2.69 MIL/uL — ABNORMAL LOW (ref 4.22–5.81)
RDW: 20.7 % — ABNORMAL HIGH (ref 11.5–15.5)
WBC: 5.7 10*3/uL (ref 4.0–10.5)
nRBC: 0 % (ref 0.0–0.2)

## 2020-11-06 LAB — CBC
HCT: 25.3 % — ABNORMAL LOW (ref 39.0–52.0)
Hemoglobin: 7.3 g/dL — ABNORMAL LOW (ref 13.0–17.0)
MCH: 30 pg (ref 26.0–34.0)
MCHC: 28.9 g/dL — ABNORMAL LOW (ref 30.0–36.0)
MCV: 104.1 fL — ABNORMAL HIGH (ref 80.0–100.0)
Platelets: 174 10*3/uL (ref 150–400)
RBC: 2.43 MIL/uL — ABNORMAL LOW (ref 4.22–5.81)
RDW: 20.5 % — ABNORMAL HIGH (ref 11.5–15.5)
WBC: 4.9 10*3/uL (ref 4.0–10.5)
nRBC: 0 % (ref 0.0–0.2)

## 2020-11-06 LAB — I-STAT ARTERIAL BLOOD GAS, ED
Acid-base deficit: 2 mmol/L (ref 0.0–2.0)
Bicarbonate: 23.3 mmol/L (ref 20.0–28.0)
Calcium, Ion: 1.17 mmol/L (ref 1.15–1.40)
HCT: 33 % — ABNORMAL LOW (ref 39.0–52.0)
Hemoglobin: 11.2 g/dL — ABNORMAL LOW (ref 13.0–17.0)
O2 Saturation: 96 %
Potassium: 3.3 mmol/L — ABNORMAL LOW (ref 3.5–5.1)
Sodium: 139 mmol/L (ref 135–145)
TCO2: 25 mmol/L (ref 22–32)
pCO2 arterial: 41.2 mmHg (ref 32.0–48.0)
pH, Arterial: 7.36 (ref 7.350–7.450)
pO2, Arterial: 87 mmHg (ref 83.0–108.0)

## 2020-11-06 LAB — COMPREHENSIVE METABOLIC PANEL
ALT: 13 U/L (ref 0–44)
ALT: 13 U/L (ref 0–44)
AST: 16 U/L (ref 15–41)
AST: 18 U/L (ref 15–41)
Albumin: 2 g/dL — ABNORMAL LOW (ref 3.5–5.0)
Albumin: 1.8 g/dL — ABNORMAL LOW (ref 3.5–5.0)
Alkaline Phosphatase: 356 U/L — ABNORMAL HIGH (ref 38–126)
Alkaline Phosphatase: 323 U/L — ABNORMAL HIGH (ref 38–126)
Anion gap: 13 (ref 5–15)
Anion gap: 13 (ref 5–15)
BUN: 66 mg/dL — ABNORMAL HIGH (ref 8–23)
BUN: 65 mg/dL — ABNORMAL HIGH (ref 8–23)
CO2: 24 mmol/L (ref 22–32)
CO2: 20 mmol/L — ABNORMAL LOW (ref 22–32)
Calcium: 8.4 mg/dL — ABNORMAL LOW (ref 8.9–10.3)
Calcium: 8 mg/dL — ABNORMAL LOW (ref 8.9–10.3)
Chloride: 102 mmol/L (ref 98–111)
Chloride: 105 mmol/L (ref 98–111)
Creatinine, Ser: 3.45 mg/dL — ABNORMAL HIGH (ref 0.61–1.24)
Creatinine, Ser: 3.17 mg/dL — ABNORMAL HIGH (ref 0.61–1.24)
GFR, Estimated: 19 mL/min — ABNORMAL LOW (ref >60)
GFR, Estimated: 21 mL/min — ABNORMAL LOW (ref >60)
Glucose, Bld: 133 mg/dL — ABNORMAL HIGH (ref 70–99)
Glucose, Bld: 155 mg/dL — ABNORMAL HIGH (ref 70–99)
Potassium: 3.3 mmol/L — ABNORMAL LOW (ref 3.5–5.1)
Potassium: 3.4 mmol/L — ABNORMAL LOW (ref 3.5–5.1)
Sodium: 139 mmol/L (ref 135–145)
Sodium: 138 mmol/L (ref 135–145)
Total Bilirubin: 1.1 mg/dL (ref 0.3–1.2)
Total Bilirubin: 0.8 mg/dL (ref 0.3–1.2)
Total Protein: 4.6 g/dL — ABNORMAL LOW (ref 6.5–8.1)
Total Protein: 4.4 g/dL — ABNORMAL LOW (ref 6.5–8.1)

## 2020-11-06 LAB — TROPONIN I (HIGH SENSITIVITY)
Troponin I (High Sensitivity): 24 ng/L — ABNORMAL HIGH (ref <18)
Troponin I (High Sensitivity): 21 ng/L — ABNORMAL HIGH (ref <18)

## 2020-11-06 LAB — HEMOGLOBIN A1C
Hgb A1c MFr Bld: 4.8 % (ref 4.8–5.6)
Mean Plasma Glucose: 91.06 mg/dL

## 2020-11-06 LAB — URINALYSIS, MICROSCOPIC (REFLEX): RBC / HPF: >50 RBC/hpf (ref 0–5)

## 2020-11-06 LAB — TYPE AND SCREEN
ABO/RH(D): O NEG
Antibody Screen: NEGATIVE

## 2020-11-06 LAB — URINALYSIS, ROUTINE W REFLEX MICROSCOPIC
Glucose, UA: 250 mg/dL — AB
Ketones, ur: 15 mg/dL — AB
Nitrite: POSITIVE — AB
Protein, ur: >300 mg/dL — AB
Specific Gravity, Urine: 1.02 (ref 1.005–1.030)
pH: 6.5 (ref 5.0–8.0)

## 2020-11-06 LAB — CBG MONITORING, ED
Glucose-Capillary: 133 mg/dL — ABNORMAL HIGH (ref 70–99)
Glucose-Capillary: 139 mg/dL — ABNORMAL HIGH (ref 70–99)
Glucose-Capillary: 131 mg/dL — ABNORMAL HIGH (ref 70–99)

## 2020-11-06 LAB — VITAMIN B12: Vitamin B-12: 1052 pg/mL — ABNORMAL HIGH (ref 180–914)

## 2020-11-06 LAB — ABO/RH: ABO/RH(D): O NEG

## 2020-11-06 LAB — GAMMA GT: GGT: 35 U/L (ref 7–50)

## 2020-11-06 LAB — HEMOGLOBIN AND HEMATOCRIT, BLOOD
HCT: 24.9 % — ABNORMAL LOW (ref 39.0–52.0)
Hemoglobin: 7 g/dL — ABNORMAL LOW (ref 13.0–17.0)

## 2020-11-06 LAB — PROCALCITONIN: Procalcitonin: 2.19 ng/mL

## 2020-11-06 LAB — FOLATE: Folate: 15.7 ng/mL (ref >5.9)

## 2020-11-06 LAB — LACTIC ACID, PLASMA
Lactic Acid, Venous: 1.9 mmol/L (ref 0.5–1.9)
Lactic Acid, Venous: 0.7 mmol/L (ref 0.5–1.9)

## 2020-11-06 LAB — SARS CORONAVIRUS 2 (TAT 6-24 HRS): SARS Coronavirus 2: NEGATIVE

## 2020-11-06 LAB — HIV ANTIBODY (ROUTINE TESTING W REFLEX): HIV Screen 4th Generation wRfx: NONREACTIVE

## 2020-11-06 LAB — PROTIME-INR
INR: 1.8 — ABNORMAL HIGH (ref 0.8–1.2)
Prothrombin Time: 19.8 seconds — ABNORMAL HIGH (ref 11.4–15.2)

## 2020-11-06 LAB — HEPARIN LEVEL (UNFRACTIONATED)
Heparin Unfractionated: 0.41 IU/mL (ref 0.30–0.70)
Heparin Unfractionated: 0.32 IU/mL (ref 0.30–0.70)

## 2020-11-06 LAB — STREP PNEUMONIAE URINARY ANTIGEN: Strep Pneumo Urinary Antigen: NEGATIVE

## 2020-11-06 MED ORDER — MIDODRINE HCL 5 MG PO TABS: 10.0000 mg | ORAL_TABLET | Freq: Three times a day (TID) | ORAL | Status: DC

## 2020-11-06 MED ORDER — SODIUM CHLORIDE 0.9 % IV BOLUS (SEPSIS)
1000.0000 mL | Freq: Once | INTRAVENOUS | Status: AC
  Administered 2020-11-06: 1000 mL via INTRAVENOUS

## 2020-11-06 MED ORDER — ONDANSETRON HCL 4 MG/2ML IJ SOLN: 4.0000 mg | Freq: Four times a day (QID) | INTRAMUSCULAR | Status: DC | PRN

## 2020-11-06 MED ORDER — PANTOPRAZOLE SODIUM 40 MG IV SOLR: 40.0000 mg | Freq: Every day | INTRAVENOUS | Status: DC

## 2020-11-06 MED ORDER — VANCOMYCIN HCL IN DEXTROSE 1-5 GM/200ML-% IV SOLN: 1000.0000 mg | INTRAVENOUS | Status: DC

## 2020-11-06 MED ORDER — PIPERACILLIN-TAZOBACTAM IN DEX 2-0.25 GM/50ML IV SOLN: 2.2500 g | Freq: Three times a day (TID) | INTRAVENOUS | 0 refills | Status: AC

## 2020-11-06 MED ORDER — METOPROLOL TARTRATE 25 MG/10 ML ORAL SUSPENSION
50.0000 mg | Freq: Once | ORAL | Status: DC
  Filled 2020-11-06: qty 20

## 2020-11-06 MED ORDER — INSULIN ASPART 100 UNIT/ML ~~LOC~~ SOLN
0.0000 [IU] | SUBCUTANEOUS | Status: DC
  Administered 2020-11-06 (×2): 1 [IU] via SUBCUTANEOUS

## 2020-11-06 MED ORDER — PIPERACILLIN-TAZOBACTAM IN DEX 2-0.25 GM/50ML IV SOLN
2.2500 g | Freq: Three times a day (TID) | INTRAVENOUS | Status: DC
  Administered 2020-11-06: 2.25 g via INTRAVENOUS
  Filled 2020-11-06 (×3): qty 50

## 2020-11-06 MED ORDER — VANCOMYCIN HCL 2000 MG/400ML IV SOLN
2000.0000 mg | Freq: Once | INTRAVENOUS | Status: AC
  Administered 2020-11-06: 2000 mg via INTRAVENOUS
  Filled 2020-11-06: qty 400

## 2020-11-06 MED ORDER — SODIUM CHLORIDE 0.9 % IV SOLN
1000.0000 mL | INTRAVENOUS | Status: DC
  Administered 2020-11-06 (×2): 1000 mL via INTRAVENOUS

## 2020-11-06 MED ORDER — PIPERACILLIN-TAZOBACTAM IN DEX 2-0.25 GM/50ML IV SOLN
2.2500 g | INTRAVENOUS | Status: AC
  Administered 2020-11-06: 2.25 g via INTRAVENOUS
  Filled 2020-11-06: qty 50

## 2020-11-06 MED ORDER — ATORVASTATIN CALCIUM 10 MG PO TABS
20.0000 mg | ORAL_TABLET | Freq: Every day | ORAL | Status: DC
  Filled 2020-11-06: qty 2

## 2020-11-06 MED ORDER — SODIUM CHLORIDE 0.9 % IV BOLUS
2000.0000 mL | Freq: Once | INTRAVENOUS | Status: AC
  Administered 2020-11-06: 2000 mL via INTRAVENOUS

## 2020-11-06 MED ORDER — MIDODRINE HCL 10 MG PO TABS
10.0000 mg | ORAL_TABLET | Freq: Three times a day (TID) | ORAL | 0 refills | Status: AC
Start: 2020-11-06 — End: ?

## 2020-11-06 MED ORDER — NOREPINEPHRINE 4 MG/250ML-% IV SOLN: 0.0000 ug/min | INTRAVENOUS | 0 refills | Status: AC

## 2020-11-06 MED ORDER — POLYETHYLENE GLYCOL 3350 17 G PO PACK: 17.0000 g | PACK | Freq: Every day | ORAL | Status: DC | PRN

## 2020-11-06 MED ORDER — DEXTROSE-NACL 5-0.45 % IV SOLN: INTRAVENOUS | Status: DC

## 2020-11-06 MED ORDER — DOCUSATE SODIUM 100 MG PO CAPS: 100.0000 mg | ORAL_CAPSULE | Freq: Two times a day (BID) | ORAL | Status: DC | PRN

## 2020-11-06 MED ORDER — VANCOMYCIN HCL IN DEXTROSE 1-5 GM/200ML-% IV SOLN: 1000.0000 mg | INTRAVENOUS | 0 refills | Status: AC

## 2020-11-06 MED ORDER — SODIUM CHLORIDE 0.9 % IV BOLUS
1000.0000 mL | Freq: Once | INTRAVENOUS | Status: AC
  Administered 2020-11-06: 1000 mL via INTRAVENOUS

## 2020-11-06 MED ORDER — METOCLOPRAMIDE HCL 5 MG/ML IJ SOLN: 10.0000 mg | Freq: Three times a day (TID) | INTRAMUSCULAR | Status: DC

## 2020-11-06 MED ORDER — VITAL HIGH PROTEIN PO LIQD
1000.0000 mL | ORAL | Status: DC
  Filled 2020-11-06: qty 1000

## 2020-11-06 MED ORDER — NOREPINEPHRINE 4 MG/250ML-% IV SOLN
0.0000 ug/min | INTRAVENOUS | Status: DC
  Administered 2020-11-06: 2 ug/min via INTRAVENOUS
  Filled 2020-11-06: qty 250

## 2020-11-06 MED ORDER — HEPARIN (PORCINE) 25000 UT/250ML-% IV SOLN
1350.0000 [IU]/h | INTRAVENOUS | Status: DC
  Administered 2020-11-06 (×2): 1350 [IU]/h via INTRAVENOUS
  Filled 2020-11-06: qty 250

## 2020-11-06 MED ORDER — DEXTROSE 5 % IV SOLN: Freq: Once | INTRAVENOUS | Status: DC

## 2020-11-06 MED ORDER — ALBUMIN HUMAN 25 % IV SOLN
25.0000 g | Freq: Four times a day (QID) | INTRAVENOUS | Status: DC
  Filled 2020-11-06 (×4): qty 100

## 2020-11-06 NOTE — ED Notes (Signed)
Pt asked a nurse tech to call her sister. Pt also told this RN that he would like his sister to come visit. I reminded the pt that d/t COVID he can only have one visitor. Pt wanted his sister to come. Sister notified.

## 2020-11-06 NOTE — ED Provider Notes (Signed)
[facility] EMERGENCY DEPARTMENT Provider Note  CSN: [id number] Arrival date & time: 11/23/2020 0025  Chief Complaint(s) ischemic bowel  HPI Eric Spence is a 65 y.o. male with a past medical history listed below including cardiac arrest and acute respiratory failure requiring trach.  Patient is also now PEG dependent.  [facility] and was being treated for A. fib with Cardizem, metoprolol and heparin.  Patient also received antibiotics for right-sided pneumonia.  Yesterday he developed nausea and vomiting and obtain a CT scan which showed signs concerning for possible ischemic small bowel prompting his transfer to the emergency department.  Patient is nonverbal due to his trache, but is able to mouth words. He denies abd pain or current nausea.  HPI  Past Medical History Past Medical History:  Diagnosis Date  . Acute on chronic respiratory failure with hypoxia ([place])   . Atrial fibrillation with rapid ventricular response ([employer])   . Cardiac arrest ([place])   . End stage renal failure on dialysis ([place])   . Non-STEMI (non-ST elevated myocardial infarction) [facility])    Patient Active Problem List   Diagnosis Date Noted  . Acute on chronic respiratory failure with hypoxia ([place])   . Atrial fibrillation with rapid ventricular response ([place])   . End stage renal failure on dialysis ([place])   . Cardiac arrest ([place])   . Non-STEMI (non-ST elevated myocardial infarction) ([place])    Home Medication(s) Prior to Admission medications   Medication Sig Start Date End Date Taking? Authorizing Provider  aspirin 81 MG chewable tablet Chew 81 mg by mouth daily. 10/31/20 11/30/20 Yes [provider]  melatonin 3 MG TABS tablet Take 3 mg by mouth at bedtime. 10/31/20 11/30/20 Yes [provider]  atorvastatin (LIPITOR) 20 MG tablet Take 20 mg by mouth daily. 08/02/20   [provider]  BREO ELLIPTA 100-25 MCG/INH AEPB Inhale 1 puff into  the lungs daily. 09/02/20   [provider]  diltiazem (CARDIZEM CD) 180 MG 24 hr capsule Take 180 mg by mouth daily. 09/02/20   [provider]  furosemide (LASIX) 40 MG tablet Take 40 mg by mouth 3 (three) times daily. 06/29/20   [provider]  LANTUS SOLOSTAR 100 UNIT/ML Solostar Pen Inject 60 Units into the skin daily. 09/06/20   [provider]  lidocaine-prilocaine (EMLA) cream SMARTSIG:Sparingly Topical 09/12/20   [provider]  metoprolol tartrate (LOPRESSOR) 50 MG tablet Take 50 mg by mouth 2 (two) times daily. 06/29/20   [provider]  Multiple Vitamins-Minerals (PRORENAL + D) TABS Take 1 tablet by mouth daily. 07/26/20   [provider]  PACERONE 200 MG tablet Take 400 mg by mouth in the morning and at bedtime. 08/02/20   [provider]  pantoprazole (PROTONIX) 40 MG tablet Take 40 mg by mouth daily. 06/29/20   [provider]  sevelamer carbonate (RENVELA) 800 MG tablet Take 800 mg by mouth 4 (four) times daily. 09/02/20   [provider]  VENTOLIN HFA 108 (90 Base) MCG/ACT inhaler Inhale 2 puffs into the lungs every 4 (four) hours as needed. 09/02/20   [provider]  Past Surgical History ** The histories are not reviewed yet. Please review them in the "History" navigator section and refresh this [place]. Family History No family history on file.  Social History   Allergies Cephalexin, Codeine, Dextromethorphan-guaifenesin, Sulfamethoxazole-trimethoprim, and Azithromycin  Review of Systems Review of Systems All other systems are reviewed and are negative for acute change except as noted in the HPI  Physical Exam Vital Signs  I have reviewed the triage vital signs BP (!) 77/42 (BP Location: Right Arm) Comment: Simultaneous filing. User may not have  seen previous data.  Pulse (!) 103 Comment: Simultaneous filing. User may not have seen previous data.  Temp 98.1 F (36.7 C) (Oral)   Resp (!) 22 Comment: Simultaneous filing. User may not have seen previous data.  SpO2 97% Comment: Simultaneous filing. User may not have seen previous data.  Physical Exam Vitals reviewed.  Constitutional:      General: He is not in acute distress.    Appearance: He is well-developed and well-nourished. He is not diaphoretic.  HENT:     Head: Normocephalic and atraumatic.     Nose: Nose normal.  Eyes:     General: No scleral icterus.       Right eye: No discharge.        Left eye: No discharge.     Extraocular Movements: EOM normal.     Conjunctiva/sclera: Conjunctivae normal.     Pupils: Pupils are equal, round, and reactive to light.  Neck:   Cardiovascular:     Rate and Rhythm: Normal rate and regular rhythm.     Heart sounds: No murmur heard. No friction rub. No gallop.   Pulmonary:     Effort: Pulmonary effort is normal. No respiratory distress.     Breath sounds: No stridor. Examination of the left-middle field reveals decreased breath sounds and rales. Examination of the right-lower field reveals rales. Examination of the left-lower field reveals decreased breath sounds and rales. Decreased breath sounds and rales present.  Abdominal:     General: There is no distension.     Palpations: Abdomen is soft.     Tenderness: There is no abdominal tenderness. There is no guarding or rebound.    Genitourinary:    Comments: Foley in place Musculoskeletal:        General: No tenderness or edema.     Cervical back: Normal range of motion and neck supple.  Skin:    General: Skin is warm and dry.     Findings: No erythema or rash.  Neurological:     Mental Status: He is alert and oriented to person, place, and time.  Psychiatric:        Mood and Affect: Mood and affect normal.     ED Results and Treatments Labs (all labs ordered are  listed, but only abnormal results are displayed) Labs Reviewed  CBC WITH DIFFERENTIAL/PLATELET - Abnormal; Notable for the following components:      Result Value   RBC 2.69 (*)    Hemoglobin 7.9 (*)    HCT 27.9 (*)    MCV 103.7 (*)    MCHC 28.3 (*)    RDW 20.7 (*)    Monocytes Absolute 1.1 (*)    All other components within normal limits  COMPREHENSIVE METABOLIC PANEL - Abnormal; Notable for the following components:   Potassium 3.3 (*)    Glucose, Bld 133 (*)    BUN 66 (*)    Creatinine, Ser 3.45 (*)    Calcium  8.4 (*)    Total Protein 4.6 (*)    Albumin 2.0 (*)    Alkaline Phosphatase 356 (*)    GFR, Estimated 19 (*)    All other components within normal limits  PROTIME-INR - Abnormal; Notable for the following components:   Prothrombin Time 19.8 (*)    INR 1.8 (*)    All other components within normal limits  CBC - Abnormal; Notable for the following components:   RBC 2.43 (*)    Hemoglobin 7.3 (*)    HCT 25.3 (*)    MCV 104.1 (*)    MCHC 28.9 (*)    RDW 20.5 (*)    All other components within normal limits  TROPONIN I (HIGH SENSITIVITY) - Abnormal; Notable for the following components:   Troponin I (High Sensitivity) 24 (*)    All other components within normal limits  LACTIC ACID, PLASMA  HEPARIN LEVEL (UNFRACTIONATED)  TYPE AND SCREEN  ABO/RH  TROPONIN I (HIGH SENSITIVITY)                                                                                                                         EKG  EKG Interpretation  Date/Time:  Sunday November 06 2020 00:27:45 EST Ventricular Rate:  101 PR Interval:    QRS Duration: 187 QT Interval:  436 QTC Calculation: 563 R Axis:   121 Text Interpretation: Atrial fibrillation RBBB and LPFB ST depr, consider ischemia, anterolateral lds No old tracing to compare Confirmed by Addison Lank [phone]) on 11/07/2020 2:01:53 AM      Radiology CT ABDOMEN PELVIS WO CONTRAST  Result Date: 11/05/2020 CLINICAL DATA:  Nausea and  vomiting.  Question obstruction. EXAM: CT ABDOMEN AND PELVIS WITHOUT CONTRAST TECHNIQUE: Multidetector CT imaging of the abdomen and pelvis was performed following the standard protocol without IV contrast. COMPARISON:  One-view abdomen 11/04/2020 FINDINGS: Lower chest: Heart size is mildly enlarged. Coronary artery calcifications are present. No significant pericardial effusion is present. Moderate right and small left pleural effusions are present. There is partial collapse of the right lower lobe. No airway obstruction is present. Mild dependent atelectasis is present in the left lung. Hepatobiliary: Liver is unremarkable. Common bile duct and gallbladder are within normal limits. Pancreas: Unremarkable. No pancreatic ductal dilatation or surrounding inflammatory changes. Spleen: Normal in size without focal abnormality. Adrenals/Urinary Tract: Adrenal glands are normal bilaterally. Left kidney is atrophic with marked cortical thinning. 17 mm exophytic lesion near the lower pole right kidney likely represents a cyst. No other focal renal lesions are present on the right. Vascular calcifications are present without other stones. Collecting system is within normal limits. Ureters are unremarkable. Foley catheter is present within the urinary bladder. Stomach/Bowel: Stomach is unremarkable. Contrast is noted dependently within the proximal small bowel. Areas of pneumatosis are noted proximally. Extensive proximal small bowel wall thickening is present with pneumatosis in stranding of the mesentery. Distal small bowel loops are not thickened. Contrast is seen throughout the colon. There  is some diverticular change in the sigmoid colon without inflammation. Vascular/Lymphatic: Extensive atherosclerotic calcifications are present in the aorta and branch vessel. Extraluminal soft tissue prominence is noted in the infrarenal abdominal aorta with measurement 29 mm. There is no focal stranding about the aorta at this  level. There is some irregularity just below this is well. Dense calcifications extend into the iliac arteries bilaterally with suspected iliac artery stenosis. [place] of the celiac and SMA have dense calcifications but appear patent. Reproductive: Prostate is unremarkable. Other: Diffuse abdominal ascites are present. Largest collection is in the right pericolic gutter. Diffuse mesenteric stranding is associated with the presumed ischemic bowel. Musculoskeletal: Diffuse sclerotic changes are present throughout the visualized skeleton. Vertebral body heights are maintained. No pathologic fractures are present. IMPRESSION: 1. Extensive proximal small bowel wall thickening, pneumatosis, and stranding of the mesentery compatible with ischemic bowel. This involves the proximal 2/3 of the small bowel. Distal small bowel appears more normal. Although there is no definite proximal stenosis, this may be the sequela of more distal mesenteric stenosis or emboli. 2. Extraluminal soft tissue prominence in the infrarenal abdominal aorta with measurement of 29 mm. 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 Acute abnormality is considered unlikely without focal stranding. 3. Moderate right and small left pleural effusions with partial collapse of the right lower lobe. 4. Diffuse sclerotic changes throughout the visualized skeleton compatible with metastatic disease. Question prostate cancer. 5. Atrophic left kidney with marked cortical thinning. This is likely ischemic. 6. Aortic Atherosclerosis (ICD10-I70.0). Electronically Signed   By: [place] Morelle M.D.   On: 11/05/2020 12:59   DG Chest Port 1 View  Result Date: 11/25/2020 CLINICAL DATA:  Vomiting EXAM: PORTABLE CHEST 1 VIEW COMPARISON:  10/31/2020 FINDINGS: Left central line and tracheostomy remain in place, unchanged. Cardiomegaly. Worsening bilateral airspace disease, right greater than left concerning for pneumonia. Nodular densities also suspected throughout the  lungs. Cannot exclude metastases. Extensive sclerosis throughout the visualized bony structures concerning for sclerotic metastases. IMPRESSION: Worsening bilateral airspace disease concerning for pneumonia. Nodular densities in both lungs concerning for metastases. Osseous sclerosis diffusely concerning for osseous metastatic disease. Electronically Signed   By: Rolm Baptise M.D.   On: 11/20/2020 01:08    Pertinent labs & imaging results that were available during my care of the patient were reviewed by me and considered in my medical decision making (see chart for details).  Medications Ordered in ED Medications  heparin ADULT infusion 100 units/mL (25000 units/257mL) (1,350 Units/hr Intravenous New Bag/Given 11/04/2020 0423)  piperacillin-tazobactam (ZOSYN) IVPB 2.25 g (has no administration in time range)  sodium chloride 0.9 % bolus 1,000 mL (0 mLs Intravenous Stopped 11/16/2020 0420)    Followed by  sodium chloride 0.9 % bolus 1,000 mL (0 mLs Intravenous Stopped 11/07/2020 0226)    Followed by  0.9 %  sodium chloride infusion (1,000 mLs Intravenous New Bag/Given 11/24/2020 0527)  dextrose 5 %-0.45 % sodium chloride infusion ( Intravenous New Bag/Given 11/25/2020 0045)  metoprolol tartrate (LOPRESSOR) 25 mg/10 mL oral suspension 50 mg (has no administration in time range)  sodium chloride 0.9 % bolus 2,000 mL (has no administration in time range)  sodium chloride 0.9 % bolus 1,000 mL (1,000 mLs Intravenous New Bag/Given 11/03/2020 0111)  piperacillin-tazobactam (ZOSYN) IVPB 2.25 g (0 g Intravenous Stopped 11/27/2020 0239)  Procedures .1-3 Lead EKG Interpretation Performed by: Fatima Blank, MD Authorized by: Fatima Blank, MD     Interpretation: abnormal     ECG rate:  90   Rhythm: atrial fibrillation     Ectopy: none     Conduction: normal   .Critical  Care Performed by: Fatima Blank, MD Authorized by: Fatima Blank, MD   Critical care provider statement:    Critical care time (minutes):  60   Critical care was necessary to treat or prevent imminent or life-threatening deterioration of the following conditions:  Circulatory failure   Critical care was time spent personally by me on the following activities:  Discussions with consultants, evaluation of patient's response to treatment, examination of patient, ordering and performing treatments and interventions, ordering and review of laboratory studies, ordering and review of radiographic studies, pulse oximetry, re-evaluation of patient's condition, obtaining history from patient or surrogate and review of old charts    (including critical care time)  Medical Decision Making / ED Course I have reviewed the nursing notes for this encounter and the patient's prior records (if available in EHR or on provided paperwork).   Eric Spence was evaluated in Emergency Department on 11/19/2020 for the symptoms described in the history of present illness. He was evaluated in the context of the global COVID-19 pandemic, which necessitated consideration that the patient might be at risk for infection with the SARS-CoV-2 virus that causes COVID-19. Institutional protocols and algorithms that pertain to the evaluation of patients at risk for COVID-19 are in a state of rapid change based on information released by regulatory bodies including the CDC and federal and state organizations. These policies and algorithms were followed during the patient's care in the ED.  Patient presents for evaluation of possible ischemic bowel. Noted to be hypotensive upon arrival. In A. fib with rate controlled. Patient's abdomen is completely nontender even to deep palpation. Screening labs were repeated as have not been done in the last 48 hours. CBC without leukocytosis. Renal function stable. Lactic  acid negative.  For his hypotension patient received 2 L of IV fluid and has been maintaining since.  Labs and exam are not consistent with ischemic bowel.  Consulted general surgery and spoke with Dr. Rosendo Gros who evaluated patient in the emergency department in agreement.  Additional work-up included a chest x-ray which did reveal worsening bilateral opacities concerning for pneumonia.  Patient was given IV Zosyn.   4:33 AM Planning to DC [facility]. Spoke with Dr. Warden Fillers who accepted.  5:52 AM Patient's BPs began to trend down again. Additional IVF were ordered. He is not septic. Hb flat. trops are flat. Likely due to 3rd spacing. Will call ICU for admission.     Final Clinical Impression(s) / ED Diagnoses Final diagnoses:  Hypotension      This chart was dictated using voice recognition software.  Despite best efforts to proofread,  errors can occur which can change the documentation meaning.   Fatima Blank, MD 11/07/2020 [phone]

## 2020-11-06 NOTE — Consult Note (Addendum)
NAME:  Eric Spence, MRN:  [id number], DOB:  April 12, 1956, LOS: 0 ADMISSION DATE:  11/03/2020, CONSULTATION DATE: 11/07/2020 REFERRING MD: Addison Lank, CHIEF COMPLAINT:    Brief History:   65 yo man, chronic trach peg after recent hospitalization for pna, cardiac arrest, tx [facility] with n/v, suspicious CT abd findings for bowel ischemia, hypotension.  History of Present Illness:  65 year old man with hx of cardiac arrest, tracheostomy/PEG dependent, atrial fibrillation,  [facility].   2/5 developed N/V, CT scan showed signs of bowel ischemia prompting transfer to ED.   Hypotensive on arrival to the ED.  Non tender abdomen.  2 L IV fluid given.   IV zosyn given for PNA.   BP at [facility] according to the daily notes [id number] systolic over 32D diastolic. (lower on 2/1 and 2/1)  Slightly elevated AP  Hypoalbuminemia Macrocytic anemia.   INR 1.8  Last HD 2/4  Past Medical History:  Acute on chronic respiratory failure, last  Vent settings on 2/5  assist control FiO2 is 30% tidal volume 500 with a PEEP of 5 Atrial fibrillation, on heparin since 2/1 Hx of Cardiac arrest 12/21 ESRD on HD PTA 12/21 HLD CM Acute cardiopulmonary arrest-status post ROSC on 12/28 Severe sepsis likely from recurrent pneumonia-Previous cultures growing strep pneumonia Moraxella catarrhalis. MRSA nasal screen positive. Atrial fibrillation with RVR Coronary artery disease with history of myocardial infarction -stent placed in 2012 Type 2 diabetes mellitus Anemia of chronic kidney disease On home o2 3L  Prior to admission  ASA, lipitor, melatonin, breo ellipta, cardizem, lasix, lantus, amiodarone, metoprolol, protonix, renvela, albuterol   12/25-1/31 [facility] Course:  Initially presented with chest pain.  Stress test.  Diarrhea.  12/29: 2 cardiac arrests, shock requiring pressors. Pneumonia.  CT scan of the chest shows dense consolidation in the right consistent  with pneumonia, along with diffuse osseous sclerotic disease consistent with possible metastasis, amio and cardizem for afib with RVR. Trach eventually done prior to d/c.    Significant Hospital Events:    Consults:  Surgery consulted and signed off   Procedures:    Significant Diagnostic Tests:  CT abd non contrast1. Extensive proximal small bowel wall thickening, pneumatosis, and stranding of the mesentery compatible with ischemic bowel. This involves the proximal 2/3 of the small bowel. Distal small bowel appears more normal. Although there is no definite proximal stenosis, this may be the sequela of more distal mesenteric stenosis or emboli. 2. Extraluminal soft tissue prominence in the infrarenal abdominal aorta with measurement of 29 mm. 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 Acute abnormality is considered unlikely without focal stranding. 3. Moderate right and small left pleural effusions with partial collapse of the right lower lobe. 4. Diffuse sclerotic changes throughout the visualized skeleton compatible with metastatic disease. Question prostate cancer. 5. Atrophic left kidney with marked cortical thinning. This is likely ischemic. 6. Aortic Atherosclerosis (ICD10-I70.0).  CXR: IMPRESSION: Worsening bilateral airspace disease concerning for pneumonia.  Nodular densities in both lungs concerning for metastases.  Osseous sclerosis diffusely concerning for osseous metastatic disease.  Micro Data:  pending  Antimicrobials:  Zosyn  Vanc  Interim History / Subjective:    Objective   Blood pressure 90/68, pulse (!) 57, temperature 98.1 F (36.7 C), temperature source Oral, resp. rate (!) 29, SpO2 98 %.    Vent Mode: AC FiO2 (%):  [30 %] 30 % Set Rate:  [16 bmp] 16 bmp Vt Set:  [500 mL]  500 mL PEEP:  [5 cmH20] 5 cmH20 Plateau Pressure:  [16 cmH20-19 cmH20] 19 cmH20  No intake or output data in the 24 hours ending 11/20/2020 0610 There were no vitals  filed for this visit.  Examination: General: Nad, mild diaphoresis HENT: trach c/d/i, L IJ central line in place, C/D/I, perrl,  Lungs: CTAB Cardiovascular: RRR Abdomen: somewhat distended, slow bowel sounds, intermittent, minimal tenderness in RL quadrant.  Extremities: 1+ pitting edema to mid lower legs Neuro: alert.  Answers yes no questions appropriately  GU: foley with scant dark urine  Bedside echo: cardiac function grossly normal. Unable to assess subxiphoid view due to abd obesity/distention.     Resolved Hospital Problem list     Assessment & Plan:   Hypotension, pneumonia:  possible septic shock.  Cardiac function appears good.  confouded by no fever, no leukocytosis, nl lactate.  Bl cultures pending.  Sputum cultures and pna studies pending.  R sided opacity on CXR.  Also has findings c/w possible metatstatic cancer that could be contributing?  CT findings c/w consolidation/pna.  Cont HCAP coverage for now.   Consider abdominal source given ct findings, though labs do not suggest ischemia. NPO for possible obstruction.  Repeat labs pending.   Responding well to fluids for now.   Caution with fluids given anasarca in dependent areas.   Acute on chronic anemia: macrocytic.  H/H pending.  Fotate, B 12.   CAD holding asa for now Afib: holding metop amio due to hypotension.  DM - iss ESRD - chronic HD patient. Followed by Dr. Posey Pronto at [place].    Chronic resp failure: cont current vent settigns.  ABG pending.   Diffuse sclerotic changes throughout the visualized skeleton compatible with metastatic disease. Possibly worked up at [employer]?   Best practice (evaluated daily)  Diet: npo Pain/Anxiety/Delirium protocol (if indicated):  VAP protocol (if indicated):  DVT prophylaxis: heparin full dose  GI prophylaxis: protonix Glucose control: iss Mobility: bed  Disposition:ICU   Goals of Care:  Last date of multidisciplinary goals of care discussion:Full Code  Family and  staff present:  Summary of discussion:  Follow up goals of care discussion due:  Code Status:   Labs   CBC: Recent Labs  Lab 10/31/20 1917 11/01/20 0413 11/04/20 0700 11/22/2020 0052 11/05/2020 0454  WBC 4.6 5.4 7.7 5.7 4.9  NEUTROABS  --  3.1  --  3.8  --   HGB 8.5* 8.1* 8.0* 7.9* 7.3*  HCT 28.7* 27.8* 26.1* 27.9* 25.3*  MCV 99.7 101.5* 100.4* 103.7* 104.1*  PLT 174 190 170 181 329    Basic Metabolic Panel: Recent Labs  Lab 11/01/20 0413 11/04/20 0700 11/05/20 1022 11/09/2020 0052  NA 137 136 139 139  K 4.3 3.9 3.7 3.3*  CL 96* 99 102 102  CO2 28 26 25 24   GLUCOSE 148* 136* 159* 133*  BUN 25* 56* 45* 66*  CREATININE 3.29* 3.99* 3.00* 3.45*  CALCIUM 8.9 8.9 8.5* 8.4*  MG 2.2  --   --   --   PHOS 3.0 2.4*  --   --    GFR: CrCl cannot be calculated (Unknown ideal weight.). Recent Labs  Lab 11/01/20 0413 11/04/20 0700 11/28/2020 0050 11/16/2020 0052 11/12/2020 0454  WBC 5.4 7.7  --  5.7 4.9  LATICACIDVEN  --   --  1.9  --   --     Liver Function Tests: Recent Labs  Lab 11/01/20 0413 11/04/20 0700 11/05/20 1022 11/11/2020 0052  AST 18  --  17 16  ALT 17  --  13 13  ALKPHOS 581*  --  461* 356*  BILITOT 1.2  --  1.0 1.1  PROT 5.5*  --  5.0* 4.6*  ALBUMIN 2.6* 2.4* 2.1* 2.0*   No results for input(s): LIPASE, AMYLASE in the last 168 hours. No results for input(s): AMMONIA in the last 168 hours.  ABG    Component Value Date/Time   PHART 7.401 10/31/2020 1845   PCO2ART 45.7 10/31/2020 1845   PO2ART 107 10/31/2020 1845   HCO3 27.9 10/31/2020 1845   O2SAT 98.6 10/31/2020 1845     Coagulation Profile: Recent Labs  Lab 10/31/20 1917 11/01/20 0413 11/01/2020 0052  INR 1.2 1.3* 1.8*    Cardiac Enzymes: No results for input(s): CKTOTAL, CKMB, CKMBINDEX, TROPONINI in the last 168 hours.  HbA1C: No results found for: HGBA1C  CBG: No results for input(s): GLUCAP in the last 168 hours.  Review of Systems:   Unable to assess  Past Medical History:  He,   has a past medical history of Acute on chronic respiratory failure with hypoxia ([place]), Atrial fibrillation with rapid ventricular response ([place]), Cardiac arrest ([place]), End stage renal failure on dialysis ([place]), and Non-STEMI (non-ST elevated myocardial infarction) ([place]).   Surgical History:     Social History:      Family History:  His family history is not on file.   Allergies Allergies  Allergen Reactions  . Cephalexin Shortness Of Breath  . Codeine Shortness Of Breath  . Dextromethorphan-Guaifenesin Shortness Of Breath  . Sulfamethoxazole-Trimethoprim Shortness Of Breath  . Azithromycin Nausea And Vomiting    Pt states after taking had heart attack 2 WEEKS LATER      Home Medications  Prior to Admission medications   Medication Sig Start Date End Date Taking? Authorizing Provider  aspirin 81 MG chewable tablet Chew 81 mg by mouth daily. 10/31/20 11/30/20 Yes [provider]  melatonin 3 MG TABS tablet Take 3 mg by mouth at bedtime. 10/31/20 11/30/20 Yes [provider]  atorvastatin (LIPITOR) 20 MG tablet Take 20 mg by mouth daily. 08/02/20   [provider]  BREO ELLIPTA 100-25 MCG/INH AEPB Inhale 1 puff into the lungs daily. 09/02/20   [provider]  diltiazem (CARDIZEM CD) 180 MG 24 hr capsule Take 180 mg by mouth daily. 09/02/20   [provider]  furosemide (LASIX) 40 MG tablet Take 40 mg by mouth 3 (three) times daily. 06/29/20   [provider]  LANTUS SOLOSTAR 100 UNIT/ML Solostar Pen Inject 60 Units into the skin daily. 09/06/20   [provider]  lidocaine-prilocaine (EMLA) cream SMARTSIG:Sparingly Topical 09/12/20   [provider]  metoprolol tartrate (LOPRESSOR) 50 MG tablet Take 50 mg by mouth 2 (two) times daily. 06/29/20   [provider]  Multiple Vitamins-Minerals (PRORENAL + D) TABS Take 1 tablet by mouth daily. 07/26/20   [provider]  PACERONE 200 MG tablet Take 400 mg by  mouth in the morning and at bedtime. 08/02/20   [provider]  pantoprazole (PROTONIX) 40 MG tablet Take 40 mg by mouth daily. 06/29/20   [provider]  sevelamer carbonate (RENVELA) 800 MG tablet Take 800 mg by mouth 4 (four) times daily. 09/02/20   [provider]  VENTOLIN HFA 108 (90 Base) MCG/ACT inhaler Inhale 2 puffs into the lungs every 4 (four) hours as needed. 09/02/20   [provider]     Critical care time: 60 minutes

## 2020-11-06 NOTE — Discharge Summary (Addendum)
Physician Discharge Summary  Patient ID: Eric Spence MRN: [id number] DOB/AGE: 02-10-1956 65 y.o.  Admit date: 11/26/2020 Discharge date: 11/10/2020    Discharge Diagnoses:  -Hypotension, pneumonia -Possible metatstatic cancer that could be contributing?   -Acute on chronic anemia, macrocytic.  -CAD  -Afib -DM  -ESRD Chronic resp failure -Diffuse sclerotic concerning for metastatic disease                    DISCHARGE PLAN BY DIAGNOSIS    HCAP possible septic shock -Bilateral airspace disease left worse than right concerning for aspiration  Chronic respiratory failure  -S/P trach and vent dependent  P: Continue broad spectrum [facility] at [facility]  Follow sputum culture  Encourage pulmonary hygiene  Mobilize as able  Enteral hydration   Acute on chronic anemia: macrocytic.  P: HGB remains stable  Intermittently trend CBC    CAD  HX of chronic Afib P: Continue ASA  At discharge  Resume home beta blocker once hypotension improves  Continuous telemetry  Midodrine   DM P: Resume Lantus at discharge   ESRD - chronic HD patient. Followed by Dr. Posey Pronto at [place].   P: Resume outpatient iHD per nephrology  Follow renal function / urine output Trend Bmet Avoid nephrotoxins Ensure adequate renal perfusion   Diffuse sclerotic changes throughout the visualized skeleton concerning with metastatic disease.  P: Possibly worked up at [employer]?                       DISCHARGE SUMMARY  Patient is a 65 year old male [facility]  Per report patient was having nausea and vomiting and underwent CT scan.  CT scan read reveals pneumatosis, thickening of the small bowel which may represent ischemic small bowel and the proximal two thirds of his small bowel.  Patient was sent to the ER for further evaluation. Patient states he has no abdominal pain.  Patient's laboratory studies reveal normal white count, no left shift, normal CO2 on his BMP, no  lactic acidosis.  General surgery was consulted for evaluation. Per General surgery  "After reviewing the CT scan patient appears to have pneumonia of his right chest, some intra-abdominal free fluid, appears to have pneumatosis and mesenteric stranding involving two thirds of the proximal bowel per Rads MD.  Patient has a compeltely benign abdomen with no abdominal pain or peritoneal signs.  He states he has no abdominal pain with deep palpation. Patient has laboratory studies that are not congruent or consistent with ischemic bowel. Patient has a CO2 and his BNP which is normal, patient has no white count. Patient has no lactic acidosis. Patient has no left shift on his CBC. Appearance of CT scan may be attributed to nausea and vomiting as reported.  There is no obstruction per CT scan as contrast is in the colon, low likelihood of ischemic bowel as discussed above.  No surgical plans.  OK to DC per EDP."          He remains stable in ED patient initially met criteria for inpatient status. He quickly improved with our interventions in the ED and deemed stable for transfer back to [facility]  With continuation of current critical care recommendations.  SIGNIFICANT DIAGNOSTIC STUDIES CT ABD/pelvis : Worsening bilateral airspace disease concerning for pneumonia. Nodular densities in both lungs concerning for metastases. Osseous sclerosis diffusely concerning for osseous metastatic disease.  MICRO DATA  Blood culture 2/6 COVID 2/6 Resp culture 2/6  ANTIBIOTICS Vanc  Zosyn   CONSULTS General surgery   TUBES / LINES CVC   Discharge Exam: General: Chronically ill appearing elderly male on mechanical ventilation  Through trach, in NAD HEENT: 8 legacy trach midline, MM pink/moist, PERRL,  Neuro: Alert and oriented x3, non-focal  CV: s1s2 regular rate and rhythm, no murmur, rubs, or gallops,  PULM:  Clear to ascultation, tolerating vent well GI: soft, bowel sounds active in all 4  quadrants, non-tender, non-distended, tolerating TF Extremities: warm/dry, no edema  Skin: no rashes or lesions   Vitals:   11/01/2020 1420 11/05/2020 1430 11/14/2020 1445 11/22/2020 1500  BP: 125/64 (!) 132/55 91/66 (!) 91/50  Pulse: (!) 103 95 (!) 103 (!) 103  Resp: 19 18 (!) 31 20  Temp:      TempSrc:      SpO2: 100% 100% 100% 100%     Discharge Labs  BMET Recent Labs  Lab 11/01/20 0413 11/04/20 0700 11/05/20 1022 11/14/2020 0052 11/14/2020 0740 11/08/2020 0808  NA 137 136 139 139 138 139  K 4.3 3.9 3.7 3.3* 3.4* 3.3*  CL 96* 99 102 102 105  --   CO2 28 26 25 24  20*  --   GLUCOSE 148* 136* 159* 133* 155*  --   BUN 25* 56* 45* 66* 65*  --   CREATININE 3.29* 3.99* 3.00* 3.45* 3.17*  --   CALCIUM 8.9 8.9 8.5* 8.4* 8.0*  --   MG 2.2  --   --   --   --   --   PHOS 3.0 2.4*  --   --   --   --     CBC Recent Labs  Lab 11/04/20 0700 11/28/2020 0052 11/20/2020 0454 11/15/2020 0740 11/18/2020 0808  HGB 8.0* 7.9* 7.3* 7.0* 11.2*  HCT 26.1* 27.9* 25.3* 24.9* 33.0*  WBC 7.7 5.7 4.9  --   --   PLT 170 181 174  --   --     Anti-Coagulation Recent Labs  Lab 10/31/20 1917 11/01/20 0413 11/24/2020 0052  INR 1.2 1.3* 1.8*          Allergies as of 11/12/2020      Reactions   Cephalexin Shortness Of Breath   Codeine Shortness Of Breath   Dextromethorphan-guaifenesin Shortness Of Breath   Sulfamethoxazole-trimethoprim Shortness Of Breath   Atorvastatin    Other reaction(s): Back Pain, Muscle Pain   Azithromycin Nausea And Vomiting   Pt states after taking had heart attack 2 WEEKS LATER   Other    Paper tape       Medication List    STOP taking these medications   CIPROFLOXACIN IV   diltiazem 30 MG tablet Commonly known as: CARDIZEM   furosemide 40 MG tablet Commonly known as: LASIX   metoprolol tartrate 50 MG tablet Commonly known as: LOPRESSOR   METRONIDAZOLE IN NACL IV     TAKE these medications   aspirin 81 MG chewable tablet Chew 81 mg by mouth daily.    atorvastatin 20 MG tablet Commonly known as: LIPITOR Take 20 mg by mouth daily.   Breo Ellipta 100-25 MCG/INH Aepb Generic drug: fluticasone furoate-vilanterol Inhale 1 puff into the lungs daily.   diltiazem 180 MG 24 hr capsule Commonly known as: CARDIZEM CD Take 180 mg by mouth daily.   Eliquis 2.5 MG Tabs tablet Generic drug: apixaban Take 2.5 mg by mouth 2 (two) times daily.   lanthanum 500 MG chewable tablet Commonly known as: FOSRENOL Chew 750 mg by mouth 3 (three)  times daily with meals.   Lantus SoloStar 100 UNIT/ML Solostar Pen Generic drug: insulin glargine Inject 60 Units into the skin daily.   lidocaine-prilocaine cream Commonly known as: EMLA SMARTSIG:Sparingly Topical   melatonin 3 MG Tabs tablet Take 3 mg by mouth at bedtime.   midodrine 10 MG tablet Commonly known as: PROAMATINE Take 1 tablet (10 mg total) by mouth 3 (three) times daily with meals.   norepinephrine 4-5 MG/250ML-% Soln Commonly known as: LEVOPHED Inject 0-40 mcg/min into the vein continuous.   Pacerone 200 MG tablet Generic drug: amiodarone Take 400 mg by mouth in the morning and at bedtime.   pantoprazole 40 MG tablet Commonly known as: PROTONIX Take 40 mg by mouth daily. What changed: Another medication with the same name was removed. Continue taking this medication, and follow the directions you see here.   piperacillin-tazobactam 2-0.25 GM/50ML IVPB Commonly known as: ZOSYN Inject 50 mLs (2.25 g total) into the vein every 8 (eight) hours.   ProRenal + D Tabs Take 1 tablet by mouth daily.   sevelamer carbonate 800 MG tablet Commonly known as: RENVELA Take 800 mg by mouth 4 (four) times daily.   vancomycin 1-5 GM/200ML-% Soln Commonly known as: VANCOCIN Inject 200 mLs (1,000 mg total) into the vein every Monday, Wednesday, and Friday with hemodialysis. Start taking on: November 07, 2020   Ventolin HFA 108 (90 Base) MCG/ACT inhaler Generic drug: albuterol Inhale 2  puffs into the lungs every 4 (four) hours as needed.      Disposition:   Discharged Condition: Eric Spence has met maximum benefit of inpatient care and is medically stable and cleared for discharge.  Patient is pending follow up as above.      Time spent on disposition:  45 Minutes.   Signed: Johnsie Cancel, NP-C [facility] Personal contact information can be found on Amion  If no response please page: Adult pulmonary and critical care medicine pager on Amion unitl 7pm After 7pm please call [phone] 11/13/2020, 3:19 PM

## 2020-11-06 NOTE — ED Notes (Signed)
Christy from [facility] on her way to pick up patient.

## 2020-11-06 NOTE — Care Management CC44 (Signed)
Condition Code 44 Documentation Completed  Patient Details  Name: Eric Spence MRN: [id number] Date of Birth: [birth year]   Condition Code 44 given:  Yes Patient signature on Condition Code 44 notice:    Documentation of 2 MD's agreement:  Yes Code 44 added to claim:       Oretha Milch, LCSW 11/21/2020, 4:34 PM

## 2020-11-06 NOTE — Progress Notes (Addendum)
ANTICOAGULATION CONSULT NOTE - Initial Consult  Pharmacy Consult for Heparin, Vancomycin and Zosyn Indication: atrial fibrillation and PNA  Not on File  Patient Measurements:    Ht Wt 102 kg   Vital Signs: Temp: 98.1 F (36.7 C) (02/06 0030) Temp Source: Oral (02/06 0030) BP: 77/42 (02/06 0030) Pulse Rate: 103 (02/06 0030)  Labs: Recent Labs    11/04/20 0700 11/04/20 1230 11/04/20 2014 11/05/20 1022 11/05/20 2119  HGB 8.0*  --   --   --   --   HCT 26.1*  --   --   --   --   PLT 170  --   --   --   --   HEPARINUNFRC 0.25* 0.25* <0.10*  --  0.59  CREATININE 3.99*  --   --  3.00*  --     CrCl cannot be calculated (Unknown ideal weight.).   Medical History: Past Medical History:  Diagnosis Date  . Acute on chronic respiratory failure with hypoxia ([place])   . Atrial fibrillation with rapid ventricular response ([place])   . Cardiac arrest ([place])   . End stage renal failure on dialysis ([place])   . Non-STEMI (non-ST elevated myocardial infarction) [facility])     Assessment: 65 y.o. M presents from [facility] to ED with vomiting - pt is on vent.   AC: Pt has been on heparin for afib since 2/1. Noted pt is HD patient. Hgb 8- low but stable. Current rate 1350 units/hr with therapeutic heparin level this morning (0.59). No AC prior to admission to [facility].  ID: Zosyn/Vanc for PNA. No antibiotics at [facility].  2/6 Zosyn >> 2/6 Vanc>>  Goal of Therapy:  Heparin level 0.3-0.7 units/ml Monitor platelets by anticoagulation protocol: Yes   Plan:  Continue heparin gtt at 1350 units/hr Will f/u daily heparin level and CBC Zosyn 2.25gm IV q8h Will f/u HD tolerance, micro data, and pt's clinical condition  Sherlon Handing, PharmD, BCPS Please see amion for complete clinical pharmacist phone list 11/05/2020,12:42 AM   Addendum [phone]) Adding Vancomycin for PNA.  Vancomycin 2gm IV now then 1gm IV with HD. Will f/u HD tolerance, level prn  Sherlon Handing, PharmD, BCPS Please see amion for  complete clinical pharmacist phone list 11/14/2020 6:39 AM

## 2020-11-06 NOTE — ED Notes (Signed)
Per Merlene Laughter, provider, she will discontinue oral meds and supplement. She will also discontinue the albumin. Pt is being discharged to [facility]. Report has been given.

## 2020-11-06 NOTE — Consult Note (Signed)
Reason for Consult: Ischemic bowel Referring Physician: [name] is an 65 y.o. male.  HPI: Patient is a 65 year old [facility]  Per report patient was having nausea and vomiting and underwent CT scan.  CT scan read reveals pneumatosis, thickening of the small bowel which may represent ischemic small bowel and the proximal two thirds of his small bowel.  Patient was sent to the ER for further evaluation. Patient states he has no abdominal pain.  Patient's laboratory studies reveal normal white count, no left shift, normal CO2 on his BMP, no lactic acidosis.  General surgery was consulted for evaluation.  Past Medical History:  Diagnosis Date  . Acute on chronic respiratory failure with hypoxia ([place])   . Atrial fibrillation with rapid ventricular response ([place])   . Cardiac arrest ([place])   . End stage renal failure on dialysis ([place])   . Non-STEMI (non-ST elevated myocardial infarction) ([place])     No family history on file.  Social History:  has no history on file for tobacco use, alcohol use, and drug use.  Allergies:  Allergies  Allergen Reactions  . Cephalexin Shortness Of Breath  . Codeine Shortness Of Breath  . Dextromethorphan-Guaifenesin Shortness Of Breath  . Sulfamethoxazole-Trimethoprim Shortness Of Breath  . Azithromycin Nausea And Vomiting    Pt states after taking had heart attack 2 WEEKS LATER     Medications: I have reviewed the patient's current medications.  Results for orders placed or performed during the hospital encounter of 11/19/2020 (from the past 48 hour(s))  Lactic acid, plasma     Status: None   Collection Time: 11/04/2020 12:50 AM  Result Value Ref Range   Lactic Acid, Venous 1.9 0.5 - 1.9 mmol/L    Comment: Performed at [facility], [address], [place], [place] [address]  CBC with Differential/Platelet     Status: Abnormal   Collection Time: 11/03/2020 12:52 AM  Result Value Ref Range    WBC 5.7 4.0 - 10.5 K/uL   RBC 2.69 (L) 4.22 - 5.81 MIL/uL   Hemoglobin 7.9 (L) 13.0 - 17.0 g/dL   HCT 27.9 (L) 39.0 - 52.0 %   MCV 103.7 (H) 80.0 - 100.0 fL   MCH 29.4 26.0 - 34.0 pg   MCHC 28.3 (L) 30.0 - 36.0 g/dL   RDW 20.7 (H) 11.5 - 15.5 %   Platelets 181 150 - 400 K/uL   nRBC 0.0 0.0 - 0.2 %   Neutrophils Relative % 67 %   Neutro Abs 3.8 1.7 - 7.7 K/uL   Lymphocytes Relative 14 %   Lymphs Abs 0.8 0.7 - 4.0 K/uL   Monocytes Relative 19 %   Monocytes Absolute 1.1 (H) 0.1 - 1.0 K/uL   Eosinophils Relative 0 %   Eosinophils Absolute 0.0 0.0 - 0.5 K/uL   Basophils Relative 0 %   Basophils Absolute 0.0 0.0 - 0.1 K/uL   Immature Granulocytes 0 %   Abs Immature Granulocytes 0.01 0.00 - 0.07 K/uL   Polychromasia PRESENT     Comment: Performed at [facility], [place] [address], [place], [place] [address]  Comprehensive metabolic panel     Status: Abnormal   Collection Time: 11/18/2020 12:52 AM  Result Value Ref Range   Sodium 139 135 - 145 mmol/L   Potassium 3.3 (L) 3.5 - 5.1 mmol/L   Chloride 102 98 - 111 mmol/L   CO2 24 22 - 32 mmol/L  Glucose, Bld 133 (H) 70 - 99 mg/dL    Comment: Glucose reference range applies only to samples taken after fasting for at least 8 hours.   BUN 66 (H) 8 - 23 mg/dL   Creatinine, Ser 3.45 (H) 0.61 - 1.24 mg/dL   Calcium 8.4 (L) 8.9 - 10.3 mg/dL   Total Protein 4.6 (L) 6.5 - 8.1 g/dL   Albumin 2.0 (L) 3.5 - 5.0 g/dL   AST 16 15 - 41 U/L   ALT 13 0 - 44 U/L   Alkaline Phosphatase 356 (H) 38 - 126 U/L   Total Bilirubin 1.1 0.3 - 1.2 mg/dL   GFR, Estimated 19 (L) >60 mL/min    Comment: (NOTE) Calculated using the CKD-EPI Creatinine Equation (2021)    Anion gap 13 5 - 15    Comment: Performed at [facility], [place] [address] [place], [place] [address]  Troponin I (High Sensitivity)     Status: Abnormal   Collection Time: 11/26/2020 12:52 AM  Result Value Ref Range   Troponin I (High Sensitivity) 24 (H) <18 ng/L    Comment:  (NOTE) Elevated high sensitivity troponin I (hsTnI) values and significant  changes across serial measurements may suggest ACS but many other  chronic and acute conditions are known to elevate hsTnI results.  Refer to the "Links" section for chest pain algorithms and additional  guidance. Performed at [facility], [place] [address] [place], [place] [address]   Protime-INR     Status: Abnormal   Collection Time: 11/05/2020 12:52 AM  Result Value Ref Range   Prothrombin Time 19.8 (H) 11.4 - 15.2 seconds   INR 1.8 (H) 0.8 - 1.2    Comment: (NOTE) INR goal varies based on device and disease states. Performed at [facility], [place] [address], [place], [place] [address]   Type and screen [place]     Status: None   Collection Time: 11/10/2020 12:52 AM  Result Value Ref Range   ABO/RH(D) O NEG    Antibody Screen NEG    Sample Expiration      11/09/2020,2359 Performed at [facility], [place] [address], [place], [place] [address]     CT ABDOMEN PELVIS WO CONTRAST  Result Date: 11/05/2020 CLINICAL DATA:  Nausea and vomiting.  Question obstruction. EXAM: CT ABDOMEN AND PELVIS WITHOUT CONTRAST TECHNIQUE: Multidetector CT imaging of the abdomen and pelvis was performed following the standard protocol without IV contrast. COMPARISON:  One-view abdomen 11/04/2020 FINDINGS: Lower chest: Heart size is mildly enlarged. Coronary artery calcifications are present. No significant pericardial effusion is present. Moderate right and small left pleural effusions are present. There is partial collapse of the right lower lobe. No airway obstruction is present. Mild dependent atelectasis is present in the left lung. Hepatobiliary: Liver is unremarkable. Common bile duct and gallbladder are within normal limits. Pancreas: Unremarkable. No pancreatic ductal dilatation or surrounding inflammatory changes. Spleen: Normal in size without focal abnormality. Adrenals/Urinary Tract: Adrenal  glands are normal bilaterally. Left kidney is atrophic with marked cortical thinning. 17 mm exophytic lesion near the lower pole right kidney likely represents a cyst. No other focal renal lesions are present on the right. Vascular calcifications are present without other stones. Collecting system is within normal limits. Ureters are unremarkable. Foley catheter is present within the urinary bladder. Stomach/Bowel: Stomach is unremarkable. Contrast is noted dependently within the proximal small bowel. Areas of pneumatosis are noted proximally. Extensive proximal small bowel  wall thickening is present with pneumatosis in stranding of the mesentery. Distal small bowel loops are not thickened. Contrast is seen throughout the colon. There is some diverticular change in the sigmoid colon without inflammation. Vascular/Lymphatic: Extensive atherosclerotic calcifications are present in the aorta and branch vessel. Extraluminal soft tissue prominence is noted in the infrarenal abdominal aorta with measurement 29 mm. There is no focal stranding about the aorta at this level. There is some irregularity just below this is well. Dense calcifications extend into the iliac arteries bilaterally with suspected iliac artery stenosis. [place] of the celiac and SMA have dense calcifications but appear patent. Reproductive: Prostate is unremarkable. Other: Diffuse abdominal ascites are present. Largest collection is in the right pericolic gutter. Diffuse mesenteric stranding is associated with the presumed ischemic bowel. Musculoskeletal: Diffuse sclerotic changes are present throughout the visualized skeleton. Vertebral body heights are maintained. No pathologic fractures are present. IMPRESSION: 1. Extensive proximal small bowel wall thickening, pneumatosis, and stranding of the mesentery compatible with ischemic bowel. This involves the proximal 2/3 of the small bowel. Distal small bowel appears more normal. Although there is no  definite proximal stenosis, this may be the sequela of more distal mesenteric stenosis or emboli. 2. Extraluminal soft tissue prominence in the infrarenal abdominal aorta with measurement of 29 mm. 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 Acute abnormality is considered unlikely without focal stranding. 3. Moderate right and small left pleural effusions with partial collapse of the right lower lobe. 4. Diffuse sclerotic changes throughout the visualized skeleton compatible with metastatic disease. Question prostate cancer. 5. Atrophic left kidney with marked cortical thinning. This is likely ischemic. 6. Aortic Atherosclerosis (ICD10-I70.0). Electronically Signed   By: San Morelle M.D.   On: 11/05/2020 12:59   DG Abd 1 View  Result Date: 11/04/2020 CLINICAL DATA:  Nausea and vomiting EXAM: ABDOMEN - 1 VIEW COMPARISON:  10/31/2020 FINDINGS: Two supine frontal views of the abdomen and pelvis are obtained, limited by portable technique and body habitus. There is diffuse gaseous distension of small bowel, nonspecific. Oral contrast is seen throughout the colon to the level of the rectum. There are no masses or abnormal calcifications identified. Diffuse sclerosis of the visualized bony structures most pronounced within the spine, pelvis, and bilateral hips, stable. 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 IMPRESSION: 1. Nonspecific gaseous distention of the small bowel, which could reflect obstruction or ileus. Electronically Signed   By: Randa Ngo M.D.   On: 11/04/2020 16:55   DG Chest Port 1 View  Result Date: 11/28/2020 CLINICAL DATA:  Vomiting EXAM: PORTABLE CHEST 1 VIEW COMPARISON:  10/31/2020 FINDINGS: Left central line and tracheostomy remain in place, unchanged. Cardiomegaly. Worsening bilateral airspace disease, right greater than left concerning for pneumonia. Nodular densities also suspected throughout the lungs.  Cannot exclude metastases. Extensive sclerosis throughout the visualized bony structures concerning for sclerotic metastases. IMPRESSION: Worsening bilateral airspace disease concerning for pneumonia. Nodular densities in both lungs concerning for metastases. Osseous sclerosis diffusely concerning for osseous metastatic disease. Electronically Signed   By: Rolm Baptise M.D.   On: 11/24/2020 01:08    Review of Systems  Constitutional: Negative.   HENT: Negative.   Eyes: Negative.   Respiratory: Negative.   Cardiovascular: Negative.   Gastrointestinal: Positive for nausea and vomiting.  Endocrine: Negative.   Neurological: Negative.    Blood pressure (!) 103/49, pulse 99, temperature 98.1 F (36.7 C), temperature source Oral, resp. rate Marland Kitchen)  22, SpO2 100 %. Physical Exam Constitutional:      General: Vital signs are normal.     Appearance: He is well-developed and well-nourished.     Comments: Conversant No acute distress  Eyes:     General: Lids are normal. No scleral icterus.    Comments: Pupils are equal round and reactive No lid lag Moist conjunctiva  Neck:     Thyroid: No thyromegaly.     Trachea: No tracheal tenderness.     Comments: No cervical lymphadenopathy Cardiovascular:     Rate and Rhythm: Normal rate and regular rhythm.     Pulses: Intact distal pulses.     Heart sounds: No murmur heard.   Pulmonary:     Effort: Pulmonary effort is normal.     Breath sounds: Normal breath sounds. No wheezing or rales.  Abdominal:     General: Bowel sounds are normal.     Palpations: Abdomen is soft. There is no hepatosplenomegaly.     Tenderness: There is no abdominal tenderness. There is no guarding or rebound.     Hernia: No hernia is present.     Comments: PEG tube in place  Skin:    General: Skin is warm.     Findings: No rash.     Nails: There is no clubbing or cyanosis.     Comments: Normal skin turgor  Neurological:     Mental Status: He is alert. Mental status is  at baseline.     Comments: Normal gait and station  Psychiatric:        Judgment: Judgment normal.     Comments: Appropriate affect     Assessment/Plan: Patient is a 65 year old male with a history of A. fib and RVR, end-stage renal disease on dialysis, cardiac arrest, pneumonia, with concern for ischemic bowel.  Discussed case with Dr. Leonette Monarch  After reviewing the CT scan patient appears to have pneumonia of his right chest, some intra-abdominal free fluid, appears to have pneumatosis and mesenteric stranding involving two thirds of the proximal bowel per Rads MD.  Patient has a compeltely benign abdomen with no abdominal pain or peritoneal signs.  He states he has no abdominal pain with deep palpation. Patient has laboratory studies that are not congruent or consistent with ischemic bowel. Patient has a CO2 and his BNP which is normal, patient has no white count. Patient has no lactic acidosis. Patient has no left shift on his CBC. Appearance of CT scan may be attributed to nausea and vomiting as reported.  If patient did have ischemic bowel associated with two thirds of his bowel patient would likely be very hemodynamically unstable as well as have significantly abnormal lab values.  There is no obstruction per CT scan as contrast is in the colon, low likelihood of ischemic bowel as discussed above.  No surgical plans.  OK to DC per EDP.     Ralene Ok 11/01/2020, 2:35 AM

## 2020-11-06 NOTE — ED Notes (Signed)
RN and RT from [facility] at bedside.

## 2020-11-07 DIAGNOSIS — N186 End stage renal disease: Secondary | ICD-10-CM | POA: Diagnosis not present

## 2020-11-07 DIAGNOSIS — I4891 Unspecified atrial fibrillation: Secondary | ICD-10-CM | POA: Diagnosis not present

## 2020-11-07 DIAGNOSIS — J9621 Acute and chronic respiratory failure with hypoxia: Secondary | ICD-10-CM | POA: Diagnosis not present

## 2020-11-07 DIAGNOSIS — Z992 Dependence on renal dialysis: Secondary | ICD-10-CM

## 2020-11-07 DIAGNOSIS — I469 Cardiac arrest, cause unspecified: Secondary | ICD-10-CM

## 2020-11-07 DIAGNOSIS — I214 Non-ST elevation (NSTEMI) myocardial infarction: Secondary | ICD-10-CM

## 2020-11-07 LAB — CBC
HCT: 25.4 % — ABNORMAL LOW (ref 39.0–52.0)
Hemoglobin: 7.2 g/dL — ABNORMAL LOW (ref 13.0–17.0)
MCH: 29.3 pg (ref 26.0–34.0)
MCHC: 28.3 g/dL — ABNORMAL LOW (ref 30.0–36.0)
MCV: 103.3 fL — ABNORMAL HIGH (ref 80.0–100.0)
Platelets: 209 10*3/uL (ref 150–400)
RBC: 2.46 MIL/uL — ABNORMAL LOW (ref 4.22–5.81)
RDW: 20.3 % — ABNORMAL HIGH (ref 11.5–15.5)
WBC: 3.7 10*3/uL — ABNORMAL LOW (ref 4.0–10.5)
nRBC: 0 % (ref 0.0–0.2)

## 2020-11-07 LAB — RENAL FUNCTION PANEL
Albumin: 1.9 g/dL — ABNORMAL LOW (ref 3.5–5.0)
Anion gap: 13 (ref 5–15)
BUN: 74 mg/dL — ABNORMAL HIGH (ref 8–23)
CO2: 19 mmol/L — ABNORMAL LOW (ref 22–32)
Calcium: 8.2 mg/dL — ABNORMAL LOW (ref 8.9–10.3)
Chloride: 105 mmol/L (ref 98–111)
Creatinine, Ser: 3.76 mg/dL — ABNORMAL HIGH (ref 0.61–1.24)
GFR, Estimated: 17 mL/min — ABNORMAL LOW (ref >60)
Glucose, Bld: 128 mg/dL — ABNORMAL HIGH (ref 70–99)
Phosphorus: 2.7 mg/dL (ref 2.5–4.6)
Potassium: 3.4 mmol/L — ABNORMAL LOW (ref 3.5–5.1)
Sodium: 137 mmol/L (ref 135–145)

## 2020-11-07 LAB — HEPARIN LEVEL (UNFRACTIONATED)
Heparin Unfractionated: 0.3 IU/mL (ref 0.30–0.70)
Heparin Unfractionated: 0.26 IU/mL — ABNORMAL LOW (ref 0.30–0.70)

## 2020-11-07 NOTE — Progress Notes (Signed)
Pulmonary Critical Care Medicine [place]   PULMONARY CRITICAL CARE SERVICE  PROGRESS NOTE  Date of Service: 11/07/2020  Eric Spence  TGG:[id number]  DOB: May 08, 1956   DOA: 11/12/2020  Referring Physician: Merton Border, MD  HPI: Eric Spence is a 65 y.o. male seen for follow up of Acute on Chronic Respiratory Failure.  The patient currently is on pressure support try to wean on T collar today the goal will be 2 hours.  Medications: Reviewed on Rounds  Physical Exam:  Vitals: Temperature 98.2 pulse 103 respiratory 19 blood pressure is 109/61 saturations 99%  Ventilator Settings on pressure support FiO2 30% pressure 12/5  . General: Comfortable at this time . Eyes: Grossly normal lids, irises & conjunctiva . ENT: grossly tongue is normal . Neck: no obvious mass . Cardiovascular: S1 S2 normal no gallop . Respiratory: Scattered rhonchi expansion is equal at this time . Abdomen: soft . Skin: no rash seen on limited exam . Musculoskeletal: not rigid . Psychiatric:unable to assess . Neurologic: no seizure no involuntary movements         Lab Data:   Basic Metabolic Panel: Recent Labs  Lab 11/01/20 0413 11/04/20 0700 11/05/20 1022 11/11/2020 0052 11/12/2020 0740 11/01/2020 0808 11/07/20 1217  NA 137 136 139 139 138 139 137  K 4.3 3.9 3.7 3.3* 3.4* 3.3* 3.4*  CL 96* 99 102 102 105  --  105  CO2 28 26 25 24  20*  --  19*  GLUCOSE 148* 136* 159* 133* 155*  --  128*  BUN 25* 56* 45* 66* 65*  --  74*  CREATININE 3.29* 3.99* 3.00* 3.45* 3.17*  --  3.76*  CALCIUM 8.9 8.9 8.5* 8.4* 8.0*  --  8.2*  MG 2.2  --   --   --   --   --   --   PHOS 3.0 2.4*  --   --   --   --  2.7    ABG: Recent Labs  Lab 10/31/20 1845 11/09/2020 0808  PHART 7.401 7.360  PCO2ART 45.7 41.2  PO2ART 107 87  HCO3 27.9 23.3  O2SAT 98.6 96.0    Liver Function Tests: Recent Labs  Lab 11/01/20 0413 11/04/20 0700 11/05/20 1022 11/13/2020 0052 11/08/2020 0740 11/07/20 1217  AST  18  --  17 16 18   --   ALT 17  --  13 13 13   --   ALKPHOS 581*  --  461* 356* 323*  --   BILITOT 1.2  --  1.0 1.1 0.8  --   PROT 5.5*  --  5.0* 4.6* 4.4*  --   ALBUMIN 2.6* 2.4* 2.1* 2.0* 1.8* 1.9*   No results for input(s): LIPASE, AMYLASE in the last 168 hours. No results for input(s): AMMONIA in the last 168 hours.  CBC: Recent Labs  Lab 11/01/20 0413 11/04/20 0700 11/05/2020 0052 11/25/2020 0454 11/27/2020 0740 11/03/2020 0808 11/07/20 1217  WBC 5.4 7.7 5.7 4.9  --   --  3.7*  NEUTROABS 3.1  --  3.8  --   --   --   --   HGB 8.1* 8.0* 7.9* 7.3* 7.0* 11.2* 7.2*  HCT 27.8* 26.1* 27.9* 25.3* 24.9* 33.0* 25.4*  MCV 101.5* 100.4* 103.7* 104.1*  --   --  103.3*  PLT 190 170 181 174  --   --  209    Cardiac Enzymes: No results for input(s): CKTOTAL, CKMB, CKMBINDEX, TROPONINI in the last 168 hours.  BNP (  last 3 results) No results for input(s): BNP in the last 8760 hours.  ProBNP (last 3 results) No results for input(s): PROBNP in the last 8760 hours.  Radiological Exams: DG Chest Port 1 View  Result Date: 11/21/2020 CLINICAL DATA:  Vomiting EXAM: PORTABLE CHEST 1 VIEW COMPARISON:  10/31/2020 FINDINGS: Left central line and tracheostomy remain in place, unchanged. Cardiomegaly. Worsening bilateral airspace disease, right greater than left concerning for pneumonia. Nodular densities also suspected throughout the lungs. Cannot exclude metastases. Extensive sclerosis throughout the visualized bony structures concerning for sclerotic metastases. IMPRESSION: Worsening bilateral airspace disease concerning for pneumonia. Nodular densities in both lungs concerning for metastases. Osseous sclerosis diffusely concerning for osseous metastatic disease. Electronically Signed   By: Rolm Baptise M.D.   On: 11/07/2020 01:08    Assessment/Plan Active Problems:   Acute on chronic respiratory failure with hypoxia ([facility])   Atrial fibrillation with rapid ventricular response ([facility])   End stage renal  failure on dialysis [facility])   Cardiac arrest ([place])   Non-STEMI (non-ST elevated myocardial infarction) ([place])   1. Acute on chronic respiratory failure hypoxia we will continue with pressure support titrate oxygen at this time continue secretion management goal is for 2 hours on T collar. 2. Atrial fibrillation rate is controlled we will continue to follow along. 3. End-stage renal failure on hemodialysis 4. Cardiac arrest rhythm is stable at this time 5. Non-STEMI no change we will continue to follow along.   I have personally seen and evaluated the patient, evaluated laboratory and imaging results, formulated the assessment and plan and placed orders. The Patient requires high complexity decision making with multiple systems involvement.  Rounds were done with the Respiratory Therapy Director and Staff therapists and discussed with nursing staff also.  [name], MD [facility] Pulmonary Critical Care Medicine Sleep Medicine

## 2020-11-07 NOTE — Progress Notes (Signed)
[facility]  ROUNDING NOTE   Subjective:  Patient seen and evaluated at bedside. Currently awake and alert. Due for hemodialysis treatment today.    Objective:  Vital signs in last 24 hours:  Temperature 98.2 pulse 103 respirations 19 blood pressure 109/61   Physical Exam: General:  Chronically ill-appearing  Head:  Normocephalic, atraumatic. Moist oral mucosal membranes  Eyes:  Anicteric  Neck:  Tracheostomy in place  Lungs:   Clear to auscultation, normal effort  Heart:  S1S2 irregular  Abdomen:   Soft, nontender, bowel sounds present  Extremities:  Trace peripheral edema.  Neurologic:  Awake, alert, following commands  Skin:  No lesions  Access:  Left upper extremity AV fistula    Basic Metabolic Panel: Recent Labs  Lab 11/01/20 0413 11/04/20 0700 11/05/20 1022 11/21/2020 0052 11/22/2020 0740 11/05/2020 0808  NA 137 136 139 139 138 139  K 4.3 3.9 3.7 3.3* 3.4* 3.3*  CL 96* 99 102 102 105  --   CO2 28 26 25 24  20*  --   GLUCOSE 148* 136* 159* 133* 155*  --   BUN 25* 56* 45* 66* 65*  --   CREATININE 3.29* 3.99* 3.00* 3.45* 3.17*  --   CALCIUM 8.9 8.9 8.5* 8.4* 8.0*  --   MG 2.2  --   --   --   --   --   PHOS 3.0 2.4*  --   --   --   --     Liver Function Tests: Recent Labs  Lab 11/01/20 0413 11/04/20 0700 11/05/20 1022 11/05/2020 0052 11/24/2020 0740  AST 18  --  17 16 18   ALT 17  --  13 13 13   ALKPHOS 581*  --  461* 356* 323*  BILITOT 1.2  --  1.0 1.1 0.8  PROT 5.5*  --  5.0* 4.6* 4.4*  ALBUMIN 2.6* 2.4* 2.1* 2.0* 1.8*   No results for input(s): LIPASE, AMYLASE in the last 168 hours. No results for input(s): AMMONIA in the last 168 hours.  CBC: Recent Labs  Lab 10/31/20 1917 11/01/20 0413 11/04/20 0700 11/08/2020 0052 11/25/2020 0454 11/20/2020 0740 11/01/2020 0808  WBC 4.6 5.4 7.7 5.7 4.9  --   --   NEUTROABS  --  3.1  --  3.8  --   --   --   HGB 8.5* 8.1* 8.0* 7.9* 7.3* 7.0* 11.2*  HCT 28.7* 27.8* 26.1* 27.9* 25.3* 24.9* 33.0*  MCV  99.7 101.5* 100.4* 103.7* 104.1*  --   --   PLT 174 190 170 181 174  --   --     Cardiac Enzymes: No results for input(s): CKTOTAL, CKMB, CKMBINDEX, TROPONINI in the last 168 hours.  BNP: Invalid input(s): POCBNP  CBG: Recent Labs  Lab 11/05/2020 0646 11/07/2020 0747 11/19/2020 1211  GLUCAP 133* 139* 131*    Microbiology: Results for orders placed or performed during the hospital encounter of 11/18/2020  SARS CORONAVIRUS 2 (TAT 6-24 HRS) Nasopharyngeal Nasopharyngeal Swab     Status: None   Collection Time: 11/12/2020  6:40 AM   Specimen: Nasopharyngeal Swab  Result Value Ref Range Status   SARS Coronavirus 2 NEGATIVE NEGATIVE Final    Comment: (NOTE) SARS-CoV-2 target nucleic acids are NOT DETECTED.  The SARS-CoV-2 RNA is generally detectable in upper and lower respiratory specimens during the acute phase of infection. Negative results do not preclude SARS-CoV-2 infection, do not rule out co-infections with other pathogens, and should not be used as the sole basis  for treatment or other patient management decisions. Negative results must be combined with clinical observations, patient history, and epidemiological information. The expected result is Negative.  Fact Sheet for Patients: SugarRoll.be  Fact Sheet for Healthcare Providers: https://www.woods-mathews.com/  This test is not yet approved or cleared by the [place] FDA and  has been authorized for detection and/or diagnosis of SARS-CoV-2 by FDA under an Emergency Use Authorization (EUA). This EUA will remain  in effect (meaning this test can be used) for the duration of the COVID-19 declaration under Se ction 564(b)(1) of the Act, 21 U.S.C. section 360bbb-3(b)(1), unless the authorization is terminated or revoked sooner.  Performed at [facility], [place] [address] [place], [place] [address]     Coagulation Studies: Recent Labs    11/09/2020 0052  LABPROT 19.8*   INR 1.8*    Urinalysis: Recent Labs    11/11/2020 1410  COLORURINE BROWN*  LABSPEC 1.020  PHURINE 6.5  GLUCOSEU 250*  HGBUR LARGE*  BILIRUBINUR MODERATE*  KETONESUR 15*  PROTEINUR >300*  NITRITE POSITIVE*  LEUKOCYTESUR SMALL*      Imaging: CT ABDOMEN PELVIS WO CONTRAST  Result Date: 11/05/2020 CLINICAL DATA:  Nausea and vomiting.  Question obstruction. EXAM: CT ABDOMEN AND PELVIS WITHOUT CONTRAST TECHNIQUE: Multidetector CT imaging of the abdomen and pelvis was performed following the standard protocol without IV contrast. COMPARISON:  One-view abdomen 11/04/2020 FINDINGS: Lower chest: Heart size is mildly enlarged. Coronary artery calcifications are present. No significant pericardial effusion is present. Moderate right and small left pleural effusions are present. There is partial collapse of the right lower lobe. No airway obstruction is present. Mild dependent atelectasis is present in the left lung. Hepatobiliary: Liver is unremarkable. Common bile duct and gallbladder are within normal limits. Pancreas: Unremarkable. No pancreatic ductal dilatation or surrounding inflammatory changes. Spleen: Normal in size without focal abnormality. Adrenals/Urinary Tract: Adrenal glands are normal bilaterally. Left kidney is atrophic with marked cortical thinning. 17 mm exophytic lesion near the lower pole right kidney likely represents a cyst. No other focal renal lesions are present on the right. Vascular calcifications are present without other stones. Collecting system is within normal limits. Ureters are unremarkable. Foley catheter is present within the urinary bladder. Stomach/Bowel: Stomach is unremarkable. Contrast is noted dependently within the proximal small bowel. Areas of pneumatosis are noted proximally. Extensive proximal small bowel wall thickening is present with pneumatosis in stranding of the mesentery. Distal small bowel loops are not thickened. Contrast is seen throughout the  colon. There is some diverticular change in the sigmoid colon without inflammation. Vascular/Lymphatic: Extensive atherosclerotic calcifications are present in the aorta and branch vessel. Extraluminal soft tissue prominence is noted in the infrarenal abdominal aorta with measurement 29 mm. There is no focal stranding about the aorta at this level. There is some irregularity just below this is well. Dense calcifications extend into the iliac arteries bilaterally with suspected iliac artery stenosis. [place] of the celiac and SMA have dense calcifications but appear patent. Reproductive: Prostate is unremarkable. Other: Diffuse abdominal ascites are present. Largest collection is in the right pericolic gutter. Diffuse mesenteric stranding is associated with the presumed ischemic bowel. Musculoskeletal: Diffuse sclerotic changes are present throughout the visualized skeleton. Vertebral body heights are maintained. No pathologic fractures are present. IMPRESSION: 1. Extensive proximal small bowel wall thickening, pneumatosis, and stranding of the mesentery compatible with ischemic bowel. This involves the proximal 2/3 of the small bowel. Distal small bowel appears more normal. Although there is no definite  proximal stenosis, this may be the sequela of more distal mesenteric stenosis or emboli. 2. Extraluminal soft tissue prominence in the infrarenal abdominal aorta with measurement of 29 mm. 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 Acute abnormality is considered unlikely without focal stranding. 3. Moderate right and small left pleural effusions with partial collapse of the right lower lobe. 4. Diffuse sclerotic changes throughout the visualized skeleton compatible with metastatic disease. Question prostate cancer. 5. Atrophic left kidney with marked cortical thinning. This is likely ischemic. 6. Aortic Atherosclerosis (ICD10-I70.0). Electronically Signed   By: San Morelle M.D.   On: 11/05/2020 12:59   DG Chest  Port 1 View  Result Date: 11/26/2020 CLINICAL DATA:  Vomiting EXAM: PORTABLE CHEST 1 VIEW COMPARISON:  10/31/2020 FINDINGS: Left central line and tracheostomy remain in place, unchanged. Cardiomegaly. Worsening bilateral airspace disease, right greater than left concerning for pneumonia. Nodular densities also suspected throughout the lungs. Cannot exclude metastases. Extensive sclerosis throughout the visualized bony structures concerning for sclerotic metastases. IMPRESSION: Worsening bilateral airspace disease concerning for pneumonia. Nodular densities in both lungs concerning for metastases. Osseous sclerosis diffusely concerning for osseous metastatic disease. Electronically Signed   By: Rolm Baptise M.D.   On: 11/16/2020 01:08     Medications:       Assessment/ Plan:  65 y.o. 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  1.  ESRD on HD MWF.    The patient is due for hemodialysis treatment today per usual schedule.  Orders have been prepared.  2.  Acute respiratory failure.  Requiring minimal vent settings.  Pulmonary/critical care following his progress.  Hopefully can be weaned from the vent.  3.  Anemia of chronic kidney disease.  Lab Results  Component Value Date   HGB 11.2 (L) 11/28/2020  Maintain the patient on Retacrit 10,000 units IV twice a week.  4.  Secondary hyperparathyroidism.  Lab Results  Component Value Date   CALCIUM 8.0 (L) 11/13/2020   CAION 1.17 11/25/2020   PHOS 2.4 (L) 11/04/2020  Phosphorus acceptable at 2.4.  Continue to monitor abdominal metabolism parameters.     LOS: 0 Jarmal Lewelling 2/7/20228:08 AM

## 2020-11-08 DIAGNOSIS — J9621 Acute and chronic respiratory failure with hypoxia: Secondary | ICD-10-CM | POA: Diagnosis not present

## 2020-11-08 DIAGNOSIS — N186 End stage renal disease: Secondary | ICD-10-CM | POA: Diagnosis not present

## 2020-11-08 DIAGNOSIS — I469 Cardiac arrest, cause unspecified: Secondary | ICD-10-CM | POA: Diagnosis not present

## 2020-11-08 DIAGNOSIS — I4891 Unspecified atrial fibrillation: Secondary | ICD-10-CM | POA: Diagnosis not present

## 2020-11-08 LAB — HEPARIN LEVEL (UNFRACTIONATED)
Heparin Unfractionated: 0.18 IU/mL — ABNORMAL LOW (ref 0.30–0.70)
Heparin Unfractionated: 0.14 IU/mL — ABNORMAL LOW (ref 0.30–0.70)

## 2020-11-08 NOTE — Progress Notes (Signed)
Pulmonary Critical Care Medicine [place]   PULMONARY CRITICAL CARE SERVICE  PROGRESS NOTE  Date of Service: 11/08/2020  Eric TEBBETTS  YJE:[id number]  DOB: [birth year]   DOA: 11/13/2020  Referring Physician: Merton Border, MD  HPI: Eric Spence is a 65 y.o. male seen for follow up of Acute on Chronic Respiratory Failure.  Patient currently is on assist control has been on 30% FiO2 good saturations are noted at this time.  Medications: Reviewed on Rounds  Physical Exam:  Vitals: Temperature is 98.4 pulse 107 respiratory rate is 24 blood pressure is 146/71 saturations are 96%  Ventilator Settings on assist control FiO2 is 30% tidal volume 500 with a PEEP of 5  . General: Comfortable at this time . Eyes: Grossly normal lids, irises & conjunctiva . ENT: grossly tongue is normal . Neck: no obvious mass . Cardiovascular: S1 S2 normal no gallop . Respiratory: Scattered rhonchi expansion equal at this time . Abdomen: soft . Skin: no rash seen on limited exam . Musculoskeletal: not rigid . Psychiatric:unable to assess . Neurologic: no seizure no involuntary movements         Lab Data:   Basic Metabolic Panel: Recent Labs  Lab 11/04/20 0700 11/05/20 1022 11/15/2020 0052 11/18/2020 0740 11/08/2020 0808 11/07/20 1217  NA 136 139 139 138 139 137  K 3.9 3.7 3.3* 3.4* 3.3* 3.4*  CL 99 102 102 105  --  105  CO2 26 25 24  20*  --  19*  GLUCOSE 136* 159* 133* 155*  --  128*  BUN 56* 45* 66* 65*  --  74*  CREATININE 3.99* 3.00* 3.45* 3.17*  --  3.76*  CALCIUM 8.9 8.5* 8.4* 8.0*  --  8.2*  PHOS 2.4*  --   --   --   --  2.7    ABG: Recent Labs  Lab 11/18/2020 0808  PHART 7.360  PCO2ART 41.2  PO2ART 87  HCO3 23.3  O2SAT 96.0    Liver Function Tests: Recent Labs  Lab 11/04/20 0700 11/05/20 1022 11/28/2020 0052 11/04/2020 0740 11/07/20 1217  AST  --  17 16 18   --   ALT  --  13 13 13   --   ALKPHOS  --  461* 356* 323*  --   BILITOT  --  1.0 1.1 0.8  --    PROT  --  5.0* 4.6* 4.4*  --   ALBUMIN 2.4* 2.1* 2.0* 1.8* 1.9*   No results for input(s): LIPASE, AMYLASE in the last 168 hours. No results for input(s): AMMONIA in the last 168 hours.  CBC: Recent Labs  Lab 11/04/20 0700 11/16/2020 0052 11/21/2020 0454 11/14/2020 0740 11/17/2020 0808 11/07/20 1217  WBC 7.7 5.7 4.9  --   --  3.7*  NEUTROABS  --  3.8  --   --   --   --   HGB 8.0* 7.9* 7.3* 7.0* 11.2* 7.2*  HCT 26.1* 27.9* 25.3* 24.9* 33.0* 25.4*  MCV 100.4* 103.7* 104.1*  --   --  103.3*  PLT 170 181 174  --   --  209    Cardiac Enzymes: No results for input(s): CKTOTAL, CKMB, CKMBINDEX, TROPONINI in the last 168 hours.  BNP (last 3 results) No results for input(s): BNP in the last 8760 hours.  ProBNP (last 3 results) No results for input(s): PROBNP in the last 8760 hours.  Radiological Exams: No results found.  Assessment/Plan Active Problems:   Acute on chronic respiratory failure with hypoxia (  [place])   Atrial fibrillation with rapid ventricular response ([facility])   End stage renal failure on dialysis [facility])   Cardiac arrest ([place])   Non-STEMI (non-ST elevated myocardial infarction) ([place])   1. Acute on chronic respiratory failure hypoxia we will continue with assist control titrate oxygen as tolerated respiratory therapy is continue to assess the RSB I mechanics 2. Atrial fibrillation rate is controlled 3. End-stage renal failure on hemodialysis 4. Cardiac arrest rhythm stable 5. Non-STEMI no change we will continue to monitor   I have personally seen and evaluated the patient, evaluated laboratory and imaging results, formulated the assessment and plan and placed orders. The Patient requires high complexity decision making with multiple systems involvement.  Rounds were done with the Respiratory Therapy Director and Staff therapists and discussed with nursing staff also.  [name], MD [facility] Pulmonary Critical Care Medicine Sleep Medicine

## 2020-11-09 DIAGNOSIS — I4891 Unspecified atrial fibrillation: Secondary | ICD-10-CM | POA: Diagnosis not present

## 2020-11-09 DIAGNOSIS — N186 End stage renal disease: Secondary | ICD-10-CM | POA: Diagnosis not present

## 2020-11-09 DIAGNOSIS — J9621 Acute and chronic respiratory failure with hypoxia: Secondary | ICD-10-CM | POA: Diagnosis not present

## 2020-11-09 DIAGNOSIS — I469 Cardiac arrest, cause unspecified: Secondary | ICD-10-CM | POA: Diagnosis not present

## 2020-11-09 LAB — HEPARIN LEVEL (UNFRACTIONATED)
Heparin Unfractionated: 0.22 IU/mL — ABNORMAL LOW (ref 0.30–0.70)
Heparin Unfractionated: 0.27 IU/mL — ABNORMAL LOW (ref 0.30–0.70)
Heparin Unfractionated: 0.26 IU/mL — ABNORMAL LOW (ref 0.30–0.70)

## 2020-11-09 LAB — C DIFFICILE (CDIFF) QUICK SCRN (NO PCR REFLEX)
C Diff antigen: POSITIVE — AB
C Diff toxin: NEGATIVE

## 2020-11-09 LAB — VANCOMYCIN, TROUGH: Vancomycin Tr: 18 ug/mL (ref 15–20)

## 2020-11-09 NOTE — Consult Note (Signed)
Ref: Patient, No Pcp Per   Subjective:  HR improving with Lanoxin dose and hemodialysis.  Objective:  Vital Signs in the last 24 hours:  P: 108, R: 34, BP: 105/60, saturation 96 %. T collar 35 % FiO2.  Physical Exam: BP Readings from Last 1 Encounters:  11/17/2020 130/66     Wt Readings from Last 1 Encounters:  No data found for Wt    Weight change:  There is no height or weight on file to calculate BMI. HEENT: [place]/AT, Eyes-Blue, Conjunctiva-Pale, Sclera-Non-icteric Neck: No JVD, No bruit, Trachea midline. Lungs:  Clearing, Bilateral. Cardiac:  Regular rhythm, normal S1 and S2, no S3. II/VI systolic murmur. Abdomen:  Soft, non-tender. BS present. Extremities:  No edema present. No cyanosis. No clubbing. CNS: AxOx0, Cranial nerves grossly intact.  Skin: Warm and dry.   Intake/Output from previous day: No intake/output data recorded.    Lab Results: BMET    Component Value Date/Time   NA 137 11/07/2020 1217   NA 139 11/19/2020 0808   NA 138 11/04/2020 0740   K 3.4 (L) 11/07/2020 1217   K 3.3 (L) 11/13/2020 0808   K 3.4 (L) 11/20/2020 0740   CL 105 11/07/2020 1217   CL 105 11/05/2020 0740   CL 102 11/01/2020 0052   CO2 19 (L) 11/07/2020 1217   CO2 20 (L) 11/16/2020 0740   CO2 24 11/11/2020 0052   GLUCOSE 128 (H) 11/07/2020 1217   GLUCOSE 155 (H) 11/18/2020 0740   GLUCOSE 133 (H) 11/25/2020 0052   BUN 74 (H) 11/07/2020 1217   BUN 65 (H) 11/02/2020 0740   BUN 66 (H) 11/12/2020 0052   CREATININE 3.76 (H) 11/07/2020 1217   CREATININE 3.17 (H) 11/28/2020 0740   CREATININE 3.45 (H) 11/28/2020 0052   CALCIUM 8.2 (L) 11/07/2020 1217   CALCIUM 8.0 (L) 11/12/2020 0740   CALCIUM 8.4 (L) 11/14/2020 0052   GFRNONAA 17 (L) 11/07/2020 1217   GFRNONAA 21 (L) 11/19/2020 0740   GFRNONAA 19 (L) 11/15/2020 0052   CBC    Component Value Date/Time   WBC 3.7 (L) 11/07/2020 1217   RBC 2.46 (L) 11/07/2020 1217   HGB 7.2 (L) 11/07/2020 1217   HCT 25.4 (L) 11/07/2020 1217   PLT  209 11/07/2020 1217   MCV 103.3 (H) 11/07/2020 1217   MCH 29.3 11/07/2020 1217   MCHC 28.3 (L) 11/07/2020 1217   RDW 20.3 (H) 11/07/2020 1217   LYMPHSABS 0.8 11/15/2020 0052   MONOABS 1.1 (H) 11/15/2020 0052   EOSABS 0.0 11/05/2020 0052   BASOSABS 0.0 11/24/2020 0052   HEPATIC Function Panel Recent Labs    11/05/20 1022 11/08/2020 0052 11/05/2020 0740  PROT 5.0* 4.6* 4.4*   HEMOGLOBIN A1C No components found for: HGA1C,  MPG CARDIAC ENZYMES No results found for: CKTOTAL, CKMB, CKMBINDEX, TROPONINI BNP No results for input(s): PROBNP in the last 8760 hours. TSH No results for input(s): TSH in the last 8760 hours. CHOLESTEROL No results for input(s): CHOL in the last 8760 hours.  Scheduled Meds: Continuous Infusions: PRN Meds:.  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    LOS: 0 days   Time spent including chart review, lab review, examination, discussion with patient/Nurse : 30 min   Dixie Dials  MD  11/09/2020, 6:02 PM

## 2020-11-09 NOTE — Progress Notes (Signed)
[facility]  ROUNDING NOTE   Subjective:  Patient currently off of the ventilator. Breathing comfortably. Due for hemodialysis treatment today.    Objective:  Vital signs in last 24 hours:  Temperature 97.9 pulse 106 respirations 23 blood pressure 102/62   Physical Exam: General:  Chronically ill-appearing  Head:  Normocephalic, atraumatic. Moist oral mucosal membranes  Eyes:  Anicteric  Neck:  Tracheostomy in place  Lungs:   Clear to auscultation, normal effort  Heart:  S1S2 irregular  Abdomen:   Soft, nontender, bowel sounds present  Extremities:  1+ peripheral edema.  Neurologic:  Awake, alert, following commands  Skin:  No acute rash  Access:  Left upper extremity AV fistula    Basic Metabolic Panel: Recent Labs  Lab 11/04/20 0700 11/05/20 1022 11/26/2020 0052 11/20/2020 0740 11/02/2020 0808 11/07/20 1217  NA 136 139 139 138 139 137  K 3.9 3.7 3.3* 3.4* 3.3* 3.4*  CL 99 102 102 105  --  105  CO2 26 25 24  20*  --  19*  GLUCOSE 136* 159* 133* 155*  --  128*  BUN 56* 45* 66* 65*  --  74*  CREATININE 3.99* 3.00* 3.45* 3.17*  --  3.76*  CALCIUM 8.9 8.5* 8.4* 8.0*  --  8.2*  PHOS 2.4*  --   --   --   --  2.7    Liver Function Tests: Recent Labs  Lab 11/04/20 0700 11/05/20 1022 11/14/2020 0052 11/16/2020 0740 11/07/20 1217  AST  --  17 16 18   --   ALT  --  13 13 13   --   ALKPHOS  --  461* 356* 323*  --   BILITOT  --  1.0 1.1 0.8  --   PROT  --  5.0* 4.6* 4.4*  --   ALBUMIN 2.4* 2.1* 2.0* 1.8* 1.9*   No results for input(s): LIPASE, AMYLASE in the last 168 hours. No results for input(s): AMMONIA in the last 168 hours.  CBC: Recent Labs  Lab 11/04/20 0700 11/24/2020 0052 11/25/2020 0454 11/18/2020 0740 11/03/2020 0808 11/07/20 1217  WBC 7.7 5.7 4.9  --   --  3.7*  NEUTROABS  --  3.8  --   --   --   --   HGB 8.0* 7.9* 7.3* 7.0* 11.2* 7.2*  HCT 26.1* 27.9* 25.3* 24.9* 33.0* 25.4*  MCV 100.4* 103.7* 104.1*  --   --  103.3*  PLT 170 181 174  --   --   209    Cardiac Enzymes: No results for input(s): CKTOTAL, CKMB, CKMBINDEX, TROPONINI in the last 168 hours.  BNP: Invalid input(s): POCBNP  CBG: Recent Labs  Lab 11/18/2020 0646 11/04/2020 0747 11/28/2020 1211  GLUCAP 133* 139* 131*    Microbiology: Results for orders placed or performed during the hospital encounter of 11/15/2020  SARS CORONAVIRUS 2 (TAT 6-24 HRS) Nasopharyngeal Nasopharyngeal Swab     Status: None   Collection Time: 11/19/2020  6:40 AM   Specimen: Nasopharyngeal Swab  Result Value Ref Range Status   SARS Coronavirus 2 NEGATIVE NEGATIVE Final    Comment: (NOTE) SARS-CoV-2 target nucleic acids are NOT DETECTED.  The SARS-CoV-2 RNA is generally detectable in upper and lower respiratory specimens during the acute phase of infection. Negative results do not preclude SARS-CoV-2 infection, do not rule out co-infections with other pathogens, and should not be used as the sole basis for treatment or other patient management decisions. Negative results must be combined with clinical observations, patient history,  and epidemiological information. The expected result is Negative.  Fact Sheet for Patients: SugarRoll.be  Fact Sheet for Healthcare Providers: https://www.woods-mathews.com/  This test is not yet approved or cleared by the [place] FDA and  has been authorized for detection and/or diagnosis of SARS-CoV-2 by FDA under an Emergency Use Authorization (EUA). This EUA will remain  in effect (meaning this test can be used) for the duration of the COVID-19 declaration under Se ction 564(b)(1) of the Act, 21 U.S.C. section 360bbb-3(b)(1), unless the authorization is terminated or revoked sooner.  Performed at [facility], [place] [address], [place], [place] [address]   Culture, blood (routine x 2)     Status: None (Preliminary result)   Collection Time: 11/11/2020  7:10 AM   Specimen: BLOOD RIGHT HAND  Result  Value Ref Range Status   Specimen Description BLOOD RIGHT HAND  Final   Special Requests   Final    BOTTLES DRAWN AEROBIC ONLY Blood Culture results may not be optimal due to an inadequate volume of blood received in culture bottles   Culture   Final    NO GROWTH 2 DAYS Performed at [facility], [place] [address] [place], [place] [address]    Report Status PENDING  Incomplete  Culture, blood (routine x 2)     Status: None (Preliminary result)   Collection Time: 11/14/2020  7:15 AM   Specimen: BLOOD RIGHT ARM  Result Value Ref Range Status   Specimen Description BLOOD RIGHT ARM  Final   Special Requests   Final    BOTTLES DRAWN AEROBIC AND ANAEROBIC Blood Culture results may not be optimal due to an inadequate volume of blood received in culture bottles   Culture   Final    NO GROWTH 2 DAYS Performed at [facility], [place] [address], [place], [place] [address]    Report Status PENDING  Incomplete    Coagulation Studies: No results for input(s): LABPROT, INR in the last 72 hours.  Urinalysis: Recent Labs    11/07/2020 1410  COLORURINE BROWN*  LABSPEC 1.020  PHURINE 6.5  GLUCOSEU 250*  HGBUR LARGE*  BILIRUBINUR MODERATE*  KETONESUR 15*  PROTEINUR >300*  NITRITE POSITIVE*  LEUKOCYTESUR SMALL*      Imaging: No results found.   Medications:       Assessment/ Plan:  65 y.o. 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  1.  ESRD on HD MWF.    Hemodialysis to be performed today.  Orders have been prepared.  2.  Acute respiratory failure.  Currently off the ventilator.  Breathing comfortably.  3.  Anemia of chronic kidney  disease.  Lab Results  Component Value Date   HGB 7.2 (L) 11/07/2020  Consider blood transfusion for hemoglobin of 7 or less.  Otherwise maintain the patient on Retacrit.  4.  Secondary hyperparathyroidism.  Lab Results  Component Value Date   CALCIUM 8.2 (L) 11/07/2020   CAION 1.17 11/27/2020   PHOS 2.7 11/07/2020  Phosphorus at target at 2.7.  Continue to monitor bone mineral metabolism parameters periodically.     LOS: 0 Kemaria Dedic 2/9/20228:11 AM

## 2020-11-09 NOTE — Consult Note (Signed)
Referring Physician: Dr. Alphonzo Cruise is an 65 y.o. male.                       Chief Complaint: Sinus tachycardia/SVT  HPI: 65 years old male with PMH of acute on chronic respiratory failure, atrial fibrillation, ESRD, Type 2 DM, s/p cardiac arrest and pneumonia has pressure agents for hypotension. He developed SVT with norepinephrine with HR around 170/min. Off norepinephrine is BP was 80-90/50's.   Past Medical History:  Diagnosis Date  . Acute on chronic respiratory failure with hypoxia ([place])   . Atrial fibrillation with rapid ventricular response ([place])   . Cardiac arrest ([place])   . End stage renal failure on dialysis ([place])   . Non-STEMI (non-ST elevated myocardial infarction) [facility])     Past Medical History:  Diagnosis Date  . Abnormal finding on EKG 12/08/2018  A FIB, RTWARD AXIS, ABN ORS-T ANGLE, CONSIDER PRIMARY T WAVE ABN  . Atrial fibrillation (CMS-HCC)  . CHF (congestive heart failure) (CMS-HCC)  . Chronic kidney disease  . Coronary artery disease  . Decubitus ulcer of buttock  . Diabetes mellitus (CMS-HCC)  . H/O echocardiogram 11/12/18 EF 45-50%  . Hypertension  . Iron deficiency anemia 02/11/2019  . Myocardial infarction (CMS-HCC)  . Shortness of breath  02 3L [place] ALL TIME  . Ulcer of left foot (CMS-HCC)   Past Surgical History:  Procedure Laterality Date  . CARDIAC CATHETERIZATION  . HERNIA REPAIR  . PR ANASTOMOSIS,AV,ANY SITE Left 02/13/2019  Procedure: Creation of a left forearm arteriovenous fistula; Surgeon: Anastasio Auerbach, MD; Location: OR [facility]; Service: General Surgery  . PR INSERT TUNNELED CV CATH W/O PORT OR PUMP N/A 12/10/2018  Procedure: Placement of hemodialysis catheter.; Surgeon: Anastasio Auerbach, MD; Location: OR [facility]; Service: General Surgery  . PR UPPER GI ENDOSCOPY,DIAGNOSIS N/A 02/08/2019  Procedure: UGI ENDO, INCLUDE ESOPHAGUS, STOMACH, & DUODENUM &/OR JEJUNUM; DX W/WO COLLECTION SPECIMN, BY BRUSH OR [place]; Surgeon: Annie Main,  MD; Location: OR [facility]; Service: General Surgery   Social History   Tobacco Use  . Smoking status: Current Every Day Smoker  Packs/day: 0.25  Types: Cigarettes  . Smokeless tobacco: Never Used  . Tobacco comment: 1-2 cigarettes a day  Substance Use Topics  . Alcohol use: Never  . Drug use: Never   Family History  No family history on file.  No family status information on file.   Allergies:  Allergies  Allergen Reactions  . Cephalexin Shortness Of Breath  . Codeine Shortness Of Breath  . Dextromethorphan-Guaifenesin Shortness Of Breath  . Sulfamethoxazole-Trimethoprim Shortness Of Breath  . Atorvastatin     Other reaction(s): Back Pain, Muscle Pain  . Azithromycin Nausea And Vomiting    Pt states after taking had heart attack 2 WEEKS LATER   . Other     Paper tape     Medications Prior to Admission  Medication Sig Dispense Refill  . apixaban (ELIQUIS) 2.5 MG TABS tablet Take 2.5 mg by mouth 2 (two) times daily.    Marland Kitchen aspirin 81 MG chewable tablet Chew 81 mg by mouth daily.    Marland Kitchen atorvastatin (LIPITOR) 20 MG tablet Take 20 mg by mouth daily.    Marland Kitchen b complex vitamins capsule Take 1 capsule by mouth daily.    Marland Kitchen BREO ELLIPTA 100-25 MCG/INH AEPB Inhale 1 puff into the lungs daily. (Patient not taking: No sig reported)    . diltiazem (CARDIZEM CD) 180 MG 24 hr  capsule Take 180 mg by mouth daily. (Patient not taking: No sig reported)    . heparin 25000 UT/250ML infusion Inject 1,200 Units/hr into the vein continuous.    . insulin lispro (HUMALOG) 100 UNIT/ML injection Inject 1 Units into the skin every 6 (six) hours. Sliding scale: From 70-150, give 0 units. From 151-200 give 2 units, from 201-250 give 4 units, from 251-300 give 6 units, from 301-350, give 8 units, form 351-400 give 10 units, greater than 400 give 12 units    . lanthanum (FOSRENOL) 500 MG chewable tablet Chew 750 mg by mouth 3 (three) times daily with meals.    Marland Kitchen LANTUS SOLOSTAR 100 UNIT/ML Solostar Pen Inject 60  Units into the skin daily. (Patient not taking: No sig reported)    . lidocaine-prilocaine (EMLA) cream SMARTSIG:Sparingly Topical (Patient not taking: No sig reported)    . melatonin 3 MG TABS tablet Take 3 mg by mouth at bedtime.    . midodrine (PROAMATINE) 10 MG tablet Take 1 tablet (10 mg total) by mouth 3 (three) times daily with meals. 10 tablet 0  . Multiple Vitamin (TAB-A-VITE/BETA CAROTENE PO) Take 1 tablet by mouth daily at 12 noon.    . Multiple Vitamins-Minerals (PRORENAL + D) TABS Take 1 tablet by mouth daily. (Patient not taking: No sig reported)    . norepinephrine (LEVOPHED) 4-5 MG/250ML-% SOLN Inject 0-40 mcg/min into the vein continuous. 250 mL 0  . nutrition supplement, JUVEN, (JUVEN) PACK Take 1 packet by mouth 3 (three) times daily.    Marland Kitchen PACERONE 200 MG tablet Take 400 mg by mouth in the morning and at bedtime.    . pantoprazole (PROTONIX) 40 MG tablet Take 40 mg by mouth daily. (Patient not taking: No sig reported)    . piperacillin-tazobactam (ZOSYN) 2-0.25 GM/50ML IVPB Inject 50 mLs (2.25 g total) into the vein every 8 (eight) hours. 50 mL 0  . scopolamine (TRANSDERM-SCOP) 1 MG/3DAYS Place 1 patch onto the skin every 3 (three) days.    . sevelamer carbonate (RENVELA) 800 MG tablet Take 800 mg by mouth 4 (four) times daily. (Patient not taking: No sig reported)    . vancomycin (VANCOCIN) 1-5 GM/200ML-% SOLN Inject 200 mLs (1,000 mg total) into the vein every Monday, Wednesday, and Friday with hemodialysis. 4000 mL 0  . VENTOLIN HFA 108 (90 Base) MCG/ACT inhaler Inhale 2 puffs into the lungs every 4 (four) hours as needed. (Patient not taking: No sig reported)      Results for orders placed or performed during the hospital encounter of 11/08/2020 (from the past 48 hour(s))  Heparin level (unfractionated)     Status: Abnormal   Collection Time: 11/08/20  4:05 AM  Result Value Ref Range   Heparin Unfractionated 0.18 (L) 0.30 - 0.70 IU/mL    Comment: (NOTE) If heparin results  are below expected values, and patient dosage has  been confirmed, suggest follow up testing of antithrombin III levels. Performed at [facility], [place] [address] [place], [place] [address]   Heparin level (unfractionated)     Status: Abnormal   Collection Time: 11/08/20  4:01 PM  Result Value Ref Range   Heparin Unfractionated 0.14 (L) 0.30 - 0.70 IU/mL    Comment: (NOTE) If heparin results are below expected values, and patient dosage has  been confirmed, suggest follow up testing of antithrombin III levels. Performed at [facility], [place] [address] [place], [place] [address]   Heparin level (unfractionated)  Status: Abnormal   Collection Time: 11/09/20 12:18 AM  Result Value Ref Range   Heparin Unfractionated 0.22 (L) 0.30 - 0.70 IU/mL    Comment: (NOTE) If heparin results are below expected values, and patient dosage has  been confirmed, suggest follow up testing of antithrombin III levels. Performed at [facility], [place] [address] [place], [place] [address]   Vancomycin, trough     Status: None   Collection Time: 11/09/20  5:57 AM  Result Value Ref Range   Vancomycin Tr 18 15 - 20 ug/mL    Comment: Performed at [facility] [address], [place], [place] [address]  Heparin level (unfractionated)     Status: Abnormal   Collection Time: 11/09/20  7:58 AM  Result Value Ref Range   Heparin Unfractionated 0.27 (L) 0.30 - 0.70 IU/mL    Comment: (NOTE) If heparin results are below expected values, and patient dosage has  been confirmed, suggest follow up testing of antithrombin III levels. Performed at [facility], [place] [address], [place], [place] [address]   C Difficile Quick Screen (NO PCR Reflex)     Status: Abnormal   Collection Time: 11/09/20  9:59 AM   Specimen: STOOL  Result Value Ref Range   C Diff antigen POSITIVE (A) NEGATIVE   C Diff toxin NEGATIVE NEGATIVE   C Diff interpretation      Results are indeterminate. Please  contact the provider listed for your campus for C diff questions in [place].    Comment: Performed at [facility], [place] [address] [place], [place] [address]  Heparin level (unfractionated)     Status: Abnormal   Collection Time: 11/09/20  2:18 PM  Result Value Ref Range   Heparin Unfractionated 0.26 (L) 0.30 - 0.70 IU/mL    Comment: (NOTE) If heparin results are below expected values, and patient dosage has  been confirmed, suggest follow up testing of antithrombin III levels. Performed at [facility], [place] [address] [place], [place] [address]    No results found.  Review Of Systems As per HPI.  P: 130, R: 24 BP 85/50, O2 sat 95 % on 30 % FiO2, 500 TV and 5 PEEP. There were no vitals taken for this visit. There is no height or weight on file to calculate BMI. General appearance: cooperative, appears stated age and moderate respiratory distress. distress Head: Normocephalic, atraumatic. Eyes: Blue eyes, pale conjunctiva, corneas clear.  Neck: No adenopathy, no carotid bruit, no JVD, supple, symmetrical, trachea midline and thyroid not enlarged. Resp: Clear to auscultation bilaterally. Cardio: Rapid, regular rate and rhythm, S1, S2 normal, II/VI systolic murmur, no click, rub or gallop GI: Soft, non-tender; bowel sounds normal; no organomegaly. Extremities: 1 + edema, no cyanosis or clubbing. Skin: Warm and dry.  Neurologic: Alert and oriented X 0..  Assessment/Plan SVT H/O atrial fibrillation Acute on chronic respiratory failure with hypoxia ESRD S/P cardiac arrest NSTEMI  DC norepinephrine. Reassess this evening.  Time spent: Review of old records, Lab, x-rays, EKG, other cardiac tests, examination, discussion with patient over 70 minutes.  Birdie Riddle, MD  11/09/2020, 5:44 PM

## 2020-11-09 NOTE — Progress Notes (Signed)
Pulmonary Critical Care Medicine [place]   PULMONARY CRITICAL CARE SERVICE  PROGRESS NOTE  Date of Service: 11/09/2020  Eric Spence  GHW:[id number]  DOB: [birth year]   DOA: 11/24/2020  Referring Physician: Merton Border, MD  HPI: Eric Spence is a 65 y.o. male seen for follow up of Acute on Chronic Respiratory Failure.  Patient currently is on T collar has been on 35% FiO2 the goal for weaning today is up to 8 hours  Medications: Reviewed on Rounds  Physical Exam:  Vitals: Temperature 97.9 pulse 106 respiratory rate 23 blood pressure is 102/8062 saturations 100%  Ventilator Settings patient is off the ventilator on T collar currently on 35% FiO2  . General: Comfortable at this time . Eyes: Grossly normal lids, irises & conjunctiva . ENT: grossly tongue is normal . Neck: no obvious mass . Cardiovascular: S1 S2 normal no gallop . Respiratory: Very coarse breath sounds . Abdomen: soft . Skin: no rash seen on limited exam . Musculoskeletal: not rigid . Psychiatric:unable to assess . Neurologic: no seizure no involuntary movements         Lab Data:   Basic Metabolic Panel: Recent Labs  Lab 11/04/20 0700 11/05/20 1022 11/26/2020 0052 11/02/2020 0740 11/21/2020 0808 11/07/20 1217  NA 136 139 139 138 139 137  K 3.9 3.7 3.3* 3.4* 3.3* 3.4*  CL 99 102 102 105  --  105  CO2 26 25 24  20*  --  19*  GLUCOSE 136* 159* 133* 155*  --  128*  BUN 56* 45* 66* 65*  --  74*  CREATININE 3.99* 3.00* 3.45* 3.17*  --  3.76*  CALCIUM 8.9 8.5* 8.4* 8.0*  --  8.2*  PHOS 2.4*  --   --   --   --  2.7    ABG: Recent Labs  Lab 11/03/2020 0808  PHART 7.360  PCO2ART 41.2  PO2ART 87  HCO3 23.3  O2SAT 96.0    Liver Function Tests: Recent Labs  Lab 11/04/20 0700 11/05/20 1022 11/19/2020 0052 11/20/2020 0740 11/07/20 1217  AST  --  17 16 18   --   ALT  --  13 13 13   --   ALKPHOS  --  461* 356* 323*  --   BILITOT  --  1.0 1.1 0.8  --   PROT  --  5.0* 4.6* 4.4*  --    ALBUMIN 2.4* 2.1* 2.0* 1.8* 1.9*   No results for input(s): LIPASE, AMYLASE in the last 168 hours. No results for input(s): AMMONIA in the last 168 hours.  CBC: Recent Labs  Lab 11/04/20 0700 11/26/2020 0052 11/03/2020 0454 11/27/2020 0740 11/26/2020 0808 11/07/20 1217  WBC 7.7 5.7 4.9  --   --  3.7*  NEUTROABS  --  3.8  --   --   --   --   HGB 8.0* 7.9* 7.3* 7.0* 11.2* 7.2*  HCT 26.1* 27.9* 25.3* 24.9* 33.0* 25.4*  MCV 100.4* 103.7* 104.1*  --   --  103.3*  PLT 170 181 174  --   --  209    Cardiac Enzymes: No results for input(s): CKTOTAL, CKMB, CKMBINDEX, TROPONINI in the last 168 hours.  BNP (last 3 results) No results for input(s): BNP in the last 8760 hours.  ProBNP (last 3 results) No results for input(s): PROBNP in the last 8760 hours.  Radiological Exams: No results found.  Assessment/Plan Active Problems:   Acute on chronic respiratory failure with hypoxia [facility])   Atrial fibrillation  with rapid ventricular response (HCC)   End stage renal failure on dialysis [facility])   Cardiac arrest ([place])   Non-STEMI (non-ST elevated myocardial infarction) ([place])   1. Chronic respiratory failure with hypoxia we will continue with the T collar titrate oxygen as tolerated  2. atrial fibrillation rate is controlled 3. End-stage renal failure on hemodialysis 4. Cardiac arrest rhythm stable at this time 5. Non-STEMI supportive care we will continue to follow along    I have personally seen and evaluated the patient, evaluated laboratory and imaging results, formulated the assessment and plan and placed orders. The Patient requires high complexity decision making with multiple systems involvement.  Rounds were done with the Respiratory Therapy Director and Staff therapists and discussed with nursing staff also.  [name], MD [facility] Pulmonary Critical Care Medicine Sleep Medicine

## 2020-11-10 DIAGNOSIS — J9621 Acute and chronic respiratory failure with hypoxia: Secondary | ICD-10-CM | POA: Diagnosis not present

## 2020-11-10 DIAGNOSIS — I469 Cardiac arrest, cause unspecified: Secondary | ICD-10-CM | POA: Diagnosis not present

## 2020-11-10 DIAGNOSIS — N186 End stage renal disease: Secondary | ICD-10-CM | POA: Diagnosis not present

## 2020-11-10 DIAGNOSIS — I4891 Unspecified atrial fibrillation: Secondary | ICD-10-CM | POA: Diagnosis not present

## 2020-11-10 LAB — HEPARIN LEVEL (UNFRACTIONATED): Heparin Unfractionated: 0.23 IU/mL — ABNORMAL LOW (ref 0.30–0.70)

## 2020-11-10 NOTE — Progress Notes (Signed)
Pulmonary Critical Care Medicine [name]   PULMONARY CRITICAL CARE SERVICE  PROGRESS NOTE  Date of Service: 11/10/2020  Eric CHARLOT  Spence:[id number]  DOB: 12-02-1955   DOA: 11/16/2020  Referring Physician: Merton Border, MD  HPI: Eric Spence is a 65 y.o. male seen for follow up of Acute on Chronic Respiratory Failure.  Patient is on T collar currently on 35% FiO2 good saturations are noted  Medications: Reviewed on Rounds  Physical Exam:  Vitals: Temperature is 98.1 pulse 110 respiratory rate 30 blood pressure is 103/62 saturations 98%  Ventilator Settings off the ventilator on T collar  . General: Comfortable at this time . Eyes: Grossly normal lids, irises & conjunctiva . ENT: grossly tongue is normal . Neck: no obvious mass . Cardiovascular: S1 S2 normal no gallop . Respiratory: Scattered rhonchi expansion is equal . Abdomen: soft . Skin: no rash seen on limited exam . Musculoskeletal: not rigid . Psychiatric:unable to assess . Neurologic: no seizure no involuntary movements         Lab Data:   Basic Metabolic Panel: Recent Labs  Lab 11/04/20 0700 11/05/20 1022 11/05/2020 0052 11/11/2020 0740 11/22/2020 0808 11/07/20 1217  NA 136 139 139 138 139 137  K 3.9 3.7 3.3* 3.4* 3.3* 3.4*  CL 99 102 102 105  --  105  CO2 26 25 24  20*  --  19*  GLUCOSE 136* 159* 133* 155*  --  128*  BUN 56* 45* 66* 65*  --  74*  CREATININE 3.99* 3.00* 3.45* 3.17*  --  3.76*  CALCIUM 8.9 8.5* 8.4* 8.0*  --  8.2*  PHOS 2.4*  --   --   --   --  2.7    ABG: Recent Labs  Lab 11/24/2020 0808  PHART 7.360  PCO2ART 41.2  PO2ART 87  HCO3 23.3  O2SAT 96.0    Liver Function Tests: Recent Labs  Lab 11/04/20 0700 11/05/20 1022 11/14/2020 0052 11/26/2020 0740 11/07/20 1217  AST  --  17 16 18   --   ALT  --  13 13 13   --   ALKPHOS  --  461* 356* 323*  --   BILITOT  --  1.0 1.1 0.8  --   PROT  --  5.0* 4.6* 4.4*  --   ALBUMIN 2.4* 2.1* 2.0* 1.8* 1.9*   No  results for input(s): LIPASE, AMYLASE in the last 168 hours. No results for input(s): AMMONIA in the last 168 hours.  CBC: Recent Labs  Lab 11/04/20 0700 11/27/2020 0052 11/07/2020 0454 11/25/2020 0740 11/18/2020 0808 11/07/20 1217  WBC 7.7 5.7 4.9  --   --  3.7*  NEUTROABS  --  3.8  --   --   --   --   HGB 8.0* 7.9* 7.3* 7.0* 11.2* 7.2*  HCT 26.1* 27.9* 25.3* 24.9* 33.0* 25.4*  MCV 100.4* 103.7* 104.1*  --   --  103.3*  PLT 170 181 174  --   --  209    Cardiac Enzymes: No results for input(s): CKTOTAL, CKMB, CKMBINDEX, TROPONINI in the last 168 hours.  BNP (last 3 results) No results for input(s): BNP in the last 8760 hours.  ProBNP (last 3 results) No results for input(s): PROBNP in the last 8760 hours.  Radiological Exams: No results found.  Assessment/Plan Active Problems:   Acute on chronic respiratory failure with hypoxia (HCC)   Atrial fibrillation with rapid ventricular response (HCC)   End stage renal failure on  dialysis [facility])   Cardiac arrest ([place])   Non-STEMI (non-ST elevated myocardial infarction) ([place])   1. Acute on chronic respiratory failure hypoxia patient will be continued on T collar currently on 35% FiO2 seems to be tolerating it well plan is going to be to continue to advance. 2. Atrial fibrillation rate controlled right now cardiology following 3. End-stage renal failure on hemodialysis being seen by nephrology 4. Cardiac arrest rhythm has been stable atrial fibrillation rate controlled 5. Non-STEMI no change   I have personally seen and evaluated the patient, evaluated laboratory and imaging results, formulated the assessment and plan and placed orders. The Patient requires high complexity decision making with multiple systems involvement.  Rounds were done with the Respiratory Therapy Director and Staff therapists and discussed with nursing staff also.  [name], MD [facility] Pulmonary Critical Care Medicine Sleep Medicine

## 2020-11-10 NOTE — Consult Note (Signed)
Ref: Patient, No Pcp Per   Subjective:  Awake. Off ventilator. Vs stable.  Objective:  Vital Signs in the last 24 hours:  P: 109, R: 30, BP: 100/51, O2 sat 97 % on 35 % FiO2 by T collar.  Physical Exam: BP Readings from Last 1 Encounters:  11/20/2020 130/66     Wt Readings from Last 1 Encounters:  No data found for Wt    Weight change:  There is no height or weight on file to calculate BMI. HEENT: [place]/AT, Eyes-Blue, Conjunctiva-Pale, Sclera-Non-icteric Neck: No JVD, No bruit, On T collar. Lungs:  Clear, Bilateral. Cardiac:  Regular rhythm, normal S1 and S2, no S3. II/VI systolic murmur. Abdomen:  Soft, non-tender. BS present. Extremities:  2 + edema present. No cyanosis. No clubbing. CNS: AxOx3, Cranial nerves grossly intact, moves all 4 extremities.  Skin: Warm and dry.   Intake/Output from previous day: No intake/output data recorded.    Lab Results: BMET    Component Value Date/Time   NA 137 11/07/2020 1217   NA 139 11/04/2020 0808   NA 138 11/07/2020 0740   K 3.4 (L) 11/07/2020 1217   K 3.3 (L) 11/22/2020 0808   K 3.4 (L) 11/09/2020 0740   CL 105 11/07/2020 1217   CL 105 11/07/2020 0740   CL 102 11/14/2020 0052   CO2 19 (L) 11/07/2020 1217   CO2 20 (L) 11/11/2020 0740   CO2 24 11/05/2020 0052   GLUCOSE 128 (H) 11/07/2020 1217   GLUCOSE 155 (H) 11/11/2020 0740   GLUCOSE 133 (H) 11/04/2020 0052   BUN 74 (H) 11/07/2020 1217   BUN 65 (H) 11/22/2020 0740   BUN 66 (H) 11/22/2020 0052   CREATININE 3.76 (H) 11/07/2020 1217   CREATININE 3.17 (H) 11/16/2020 0740   CREATININE 3.45 (H) 11/02/2020 0052   CALCIUM 8.2 (L) 11/07/2020 1217   CALCIUM 8.0 (L) 11/23/2020 0740   CALCIUM 8.4 (L) 11/14/2020 0052   GFRNONAA 17 (L) 11/07/2020 1217   GFRNONAA 21 (L) 11/25/2020 0740   GFRNONAA 19 (L) 11/25/2020 0052   CBC    Component Value Date/Time   WBC 3.7 (L) 11/07/2020 1217   RBC 2.46 (L) 11/07/2020 1217   HGB 7.2 (L) 11/07/2020 1217   HCT 25.4 (L) 11/07/2020 1217    PLT 209 11/07/2020 1217   MCV 103.3 (H) 11/07/2020 1217   MCH 29.3 11/07/2020 1217   MCHC 28.3 (L) 11/07/2020 1217   RDW 20.3 (H) 11/07/2020 1217   LYMPHSABS 0.8 11/18/2020 0052   MONOABS 1.1 (H) 11/27/2020 0052   EOSABS 0.0 11/08/2020 0052   BASOSABS 0.0 11/11/2020 0052   HEPATIC Function Panel Recent Labs    11/05/20 1022 11/28/2020 0052 11/21/2020 0740  PROT 5.0* 4.6* 4.4*   HEMOGLOBIN A1C No components found for: HGA1C,  MPG CARDIAC ENZYMES No results found for: CKTOTAL, CKMB, CKMBINDEX, TROPONINI BNP No results for input(s): PROBNP in the last 8760 hours. TSH No results for input(s): TSH in the last 8760 hours. CHOLESTEROL No results for input(s): CHOL in the last 8760 hours.  Scheduled Meds: Continuous Infusions: PRN Meds:.  Assessment/Plan: Acute on chronic respiratory failure with hypoxia Atrial fibrillation with RVR, paroxysmal ESRD S/P cardiac arrest [place] NSTEMI RV systolic failure  Small dose lanoxin for RV failure.   LOS: 0 days   Time spent including chart review, lab review, examination, discussion with patient/Tech/Nurse : 30 min   Dixie Dials  MD  11/10/2020, 9:01 AM    30

## 2020-11-11 DIAGNOSIS — J9621 Acute and chronic respiratory failure with hypoxia: Secondary | ICD-10-CM | POA: Diagnosis not present

## 2020-11-11 DIAGNOSIS — I469 Cardiac arrest, cause unspecified: Secondary | ICD-10-CM | POA: Diagnosis not present

## 2020-11-11 DIAGNOSIS — I4891 Unspecified atrial fibrillation: Secondary | ICD-10-CM | POA: Diagnosis not present

## 2020-11-11 DIAGNOSIS — N186 End stage renal disease: Secondary | ICD-10-CM | POA: Diagnosis not present

## 2020-11-11 LAB — CULTURE, BLOOD (ROUTINE X 2)
Culture: NO GROWTH
Culture: NO GROWTH

## 2020-11-11 LAB — BASIC METABOLIC PANEL
Anion gap: 12 (ref 5–15)
BUN: 73 mg/dL — ABNORMAL HIGH (ref 8–23)
CO2: 22 mmol/L (ref 22–32)
Calcium: 8.2 mg/dL — ABNORMAL LOW (ref 8.9–10.3)
Chloride: 107 mmol/L (ref 98–111)
Creatinine, Ser: 3.71 mg/dL — ABNORMAL HIGH (ref 0.61–1.24)
GFR, Estimated: 17 mL/min — ABNORMAL LOW (ref >60)
Glucose, Bld: 158 mg/dL — ABNORMAL HIGH (ref 70–99)
Potassium: 3.2 mmol/L — ABNORMAL LOW (ref 3.5–5.1)
Sodium: 141 mmol/L (ref 135–145)

## 2020-11-11 LAB — CBC
HCT: 13.9 % — ABNORMAL LOW (ref 39.0–52.0)
Hemoglobin: 3.9 g/dL — CL (ref 13.0–17.0)
MCH: 29.1 pg (ref 26.0–34.0)
MCHC: 28.1 g/dL — ABNORMAL LOW (ref 30.0–36.0)
MCV: 103.7 fL — ABNORMAL HIGH (ref 80.0–100.0)
Platelets: 199 10*3/uL (ref 150–400)
RBC: 1.34 MIL/uL — ABNORMAL LOW (ref 4.22–5.81)
RDW: 21.2 % — ABNORMAL HIGH (ref 11.5–15.5)
WBC: 7.4 10*3/uL (ref 4.0–10.5)
nRBC: 5.1 % — ABNORMAL HIGH (ref 0.0–0.2)

## 2020-11-11 LAB — PROTIME-INR
INR: 1.7 — ABNORMAL HIGH (ref 0.8–1.2)
Prothrombin Time: 19.6 seconds — ABNORMAL HIGH (ref 11.4–15.2)

## 2020-11-11 LAB — VANCOMYCIN, TROUGH: Vancomycin Tr: 22 ug/mL (ref 15–20)

## 2020-11-11 LAB — PATHOLOGIST SMEAR REVIEW

## 2020-11-11 LAB — PREPARE RBC (CROSSMATCH)

## 2020-11-11 NOTE — Progress Notes (Signed)
Pulmonary Critical Care Medicine [place]   PULMONARY CRITICAL CARE SERVICE  PROGRESS NOTE  Date of Service: 11/11/2020  Eric Spence  TIW:[id number]  DOB: 02/28/1956   DOA: 11/25/2020  Referring Physician: Merton Border, MD  HPI: Eric Spence is a 65 y.o. male seen for follow up of Acute on Chronic Respiratory Failure.  Patient is currently on assist control mode has been on 20% FiO2 hemoglobin was found to be 3.9 awaiting transfusion  Medications: Reviewed on Rounds  Physical Exam:  Vitals: Temperature 99.6 pulse 111 respiratory 35 blood pressure is 90/61 saturations 100%  Ventilator Settings assist control FiO2 28% tidal volume 500 PEEP 5  . General: Comfortable at this time . Eyes: Grossly normal lids, irises & conjunctiva . ENT: grossly tongue is normal . Neck: no obvious mass . Cardiovascular: S1 S2 normal no gallop . Respiratory: Scattered rhonchi expansion is equal . Abdomen: soft . Skin: no rash seen on limited exam . Musculoskeletal: not rigid . Psychiatric:unable to assess . Neurologic: no seizure no involuntary movements         Lab Data:   Basic Metabolic Panel: Recent Labs  Lab 11/05/20 1022 11/05/2020 0052 11/22/2020 0740 11/04/2020 0808 11/07/20 1217 11/11/20 0546  NA 139 139 138 139 137 141  K 3.7 3.3* 3.4* 3.3* 3.4* 3.2*  CL 102 102 105  --  105 107  CO2 25 24 20*  --  19* 22  GLUCOSE 159* 133* 155*  --  128* 158*  BUN 45* 66* 65*  --  74* 73*  CREATININE 3.00* 3.45* 3.17*  --  3.76* 3.71*  CALCIUM 8.5* 8.4* 8.0*  --  8.2* 8.2*  PHOS  --   --   --   --  2.7  --     ABG: Recent Labs  Lab 11/05/2020 0808  PHART 7.360  PCO2ART 41.2  PO2ART 87  HCO3 23.3  O2SAT 96.0    Liver Function Tests: Recent Labs  Lab 11/05/20 1022 11/03/2020 0052 11/08/2020 0740 11/07/20 1217  AST 17 16 18   --   ALT 13 13 13   --   ALKPHOS 461* 356* 323*  --   BILITOT 1.0 1.1 0.8  --   PROT 5.0* 4.6* 4.4*  --   ALBUMIN 2.1* 2.0* 1.8* 1.9*    No results for input(s): LIPASE, AMYLASE in the last 168 hours. No results for input(s): AMMONIA in the last 168 hours.  CBC: Recent Labs  Lab 11/22/2020 0052 11/24/2020 0454 11/01/2020 0740 11/03/2020 0808 11/07/20 1217 11/11/20 0546  WBC 5.7 4.9  --   --  3.7* 7.4  NEUTROABS 3.8  --   --   --   --   --   HGB 7.9* 7.3* 7.0* 11.2* 7.2* 3.9*  HCT 27.9* 25.3* 24.9* 33.0* 25.4* 13.9*  MCV 103.7* 104.1*  --   --  103.3* 103.7*  PLT 181 174  --   --  209 199    Cardiac Enzymes: No results for input(s): CKTOTAL, CKMB, CKMBINDEX, TROPONINI in the last 168 hours.  BNP (last 3 results) No results for input(s): BNP in the last 8760 hours.  ProBNP (last 3 results) No results for input(s): PROBNP in the last 8760 hours.  Radiological Exams: No results found.  Assessment/Plan Active Problems:   Acute on chronic respiratory failure with hypoxia (HCC)   Atrial fibrillation with rapid ventricular response (HCC)   End stage renal failure on dialysis [facility])   Cardiac arrest ([place])  Non-STEMI (non-ST elevated myocardial infarction) ([place])   1. Acute on chronic respiratory failure hypoxia patient will remain on the ventilator because of the issues with the acute GI bleed and drop in hemoglobin.  Awaiting transfusion and transfer to intensive care 2. Atrial fibrillation with RVR rate is controlled currently we will continue to monitor. 3. End-stage renal failure on hemodialysis 4. Cardiac arrest rhythm is stable at this time 5. Non-STEMI no change we will continue to monitor closely   I have personally seen and evaluated the patient, evaluated laboratory and imaging results, formulated the assessment and plan and placed orders. The Patient requires high complexity decision making with multiple systems involvement.  Rounds were done with the Respiratory Therapy Director and Staff therapists and discussed with nursing staff also.  [name], MD [facility] Pulmonary Critical Care Medicine Sleep  Medicine

## 2020-11-11 NOTE — Consult Note (Signed)
Ref: Patient, No Pcp Per   Subjective:  GI bleed with Eliquis, aspirin and heparin use. Awake. VS stable. Back on ventilator support.  Objective:  Vital Signs in the last 24 hours:  P:125-`130/min R: 29, BP: 106/76 O2 sat 100 % on 28 % FiO2, 16 A/C, 500 TV and 5 PEEP  Physical Exam: BP Readings from Last 1 Encounters:  11/13/2020 130/66     Wt Readings from Last 1 Encounters:  No data found for Wt    Weight change:  There is no height or weight on file to calculate BMI. HEENT: [place]/AT, Eyes-Blue, PERL, EOMI, Conjunctiva-Pale, Sclera-Non-icteric Neck: No JVD, No bruit, Trachea midline. Lungs:  Clear, Bilateral. Cardiac:  Regular rhythm, normal S1 and S2, no S3. II/VI systolic murmur. Abdomen:  Soft, non-tender. BS present. Extremities:  2 + edema present. No cyanosis. No clubbing. CNS: AxOx0, Cranial nerves grossly intact, moves all 4 extremities.  Skin: Warm and dry.   Intake/Output from previous day: No intake/output data recorded.    Lab Results: BMET    Component Value Date/Time   NA 141 11/11/2020 0546   NA 137 11/07/2020 1217   NA 139 11/22/2020 0808   K 3.2 (L) 11/11/2020 0546   K 3.4 (L) 11/07/2020 1217   K 3.3 (L) 11/19/2020 0808   CL 107 11/11/2020 0546   CL 105 11/07/2020 1217   CL 105 11/18/2020 0740   CO2 22 11/11/2020 0546   CO2 19 (L) 11/07/2020 1217   CO2 20 (L) 11/18/2020 0740   GLUCOSE 158 (H) 11/11/2020 0546   GLUCOSE 128 (H) 11/07/2020 1217   GLUCOSE 155 (H) 11/01/2020 0740   BUN 73 (H) 11/11/2020 0546   BUN 74 (H) 11/07/2020 1217   BUN 65 (H) 11/02/2020 0740   CREATININE 3.71 (H) 11/11/2020 0546   CREATININE 3.76 (H) 11/07/2020 1217   CREATININE 3.17 (H) 11/28/2020 0740   CALCIUM 8.2 (L) 11/11/2020 0546   CALCIUM 8.2 (L) 11/07/2020 1217   CALCIUM 8.0 (L) 11/18/2020 0740   GFRNONAA 17 (L) 11/11/2020 0546   GFRNONAA 17 (L) 11/07/2020 1217   GFRNONAA 21 (L) 11/26/2020 0740   CBC    Component Value Date/Time   WBC 7.4 11/11/2020 0546    RBC 1.34 (L) 11/11/2020 0546   HGB 3.9 (LL) 11/11/2020 0546   HCT 13.9 (L) 11/11/2020 0546   PLT 199 11/11/2020 0546   MCV 103.7 (H) 11/11/2020 0546   MCH 29.1 11/11/2020 0546   MCHC 28.1 (L) 11/11/2020 0546   RDW 21.2 (H) 11/11/2020 0546   LYMPHSABS 0.8 11/20/2020 0052   MONOABS 1.1 (H) 11/08/2020 0052   EOSABS 0.0 11/01/2020 0052   BASOSABS 0.0 11/16/2020 0052   HEPATIC Function Panel Recent Labs    11/05/20 1022 11/13/2020 0052 11/21/2020 0740  PROT 5.0* 4.6* 4.4*   HEMOGLOBIN A1C No components found for: HGA1C,  MPG CARDIAC ENZYMES No results found for: CKTOTAL, CKMB, CKMBINDEX, TROPONINI BNP No results for input(s): PROBNP in the last 8760 hours. TSH No results for input(s): TSH in the last 8760 hours. CHOLESTEROL No results for input(s): CHOL in the last 8760 hours.  Scheduled Meds: Continuous Infusions: PRN Meds:.  Assessment/Plan: Acute on chronic respiratory failure with hypoxia Acute GI bleed Anemia of acute blood loss Anemia of chronic disease Atrial fibrillation with RVR, paroxysmal ESRD S/P cardiac arrest Old NSTEMI RV systolic failure  Agree with holding anticoagulants and giving PRBCs. GI consult if anemia and GI bleed continues after PRBCs.   LOS: 0  days   Time spent including chart review, lab review, examination, discussion with patient/Nurse/PA : 30 min   Dixie Dials  MD  11/11/2020, 9:11 AM

## 2020-11-11 NOTE — Progress Notes (Signed)
[facility]  ROUNDING NOTE   Subjective:  Very significant drop in hemoglobin noted down to 3.9 this AM. Patient ordered for 3 units PRBC transfusion. Patient having GI bleeding. Due for dialysis today as well.    Objective:  Vital signs in last 24 hours:  Temperature 99.1 pulse 111 respirations 39 blood pressure 90/61   Physical Exam: General:  Chronically ill-appearing  Head:  Normocephalic, atraumatic. Moist oral mucosal membranes  Eyes:  Anicteric  Neck:  Tracheostomy in place  Lungs:   Clear to auscultation, normal effort  Heart:  S1S2 irregular  Abdomen:   Soft, nontender, bowel sounds present  Extremities:  1+ peripheral edema.  Neurologic:  Awake, alert, following commands  Skin:  No acute rash  Access:  Left upper extremity AV fistula    Basic Metabolic Panel: Recent Labs  Lab 11/05/20 1022 11/22/2020 0052 11/11/2020 0740 11/11/2020 0808 11/07/20 1217 11/11/20 0546  NA 139 139 138 139 137 141  K 3.7 3.3* 3.4* 3.3* 3.4* 3.2*  CL 102 102 105  --  105 107  CO2 25 24 20*  --  19* 22  GLUCOSE 159* 133* 155*  --  128* 158*  BUN 45* 66* 65*  --  74* 73*  CREATININE 3.00* 3.45* 3.17*  --  3.76* 3.71*  CALCIUM 8.5* 8.4* 8.0*  --  8.2* 8.2*  PHOS  --   --   --   --  2.7  --     Liver Function Tests: Recent Labs  Lab 11/05/20 1022 11/01/2020 0052 11/26/2020 0740 11/07/20 1217  AST 17 16 18   --   ALT 13 13 13   --   ALKPHOS 461* 356* 323*  --   BILITOT 1.0 1.1 0.8  --   PROT 5.0* 4.6* 4.4*  --   ALBUMIN 2.1* 2.0* 1.8* 1.9*   No results for input(s): LIPASE, AMYLASE in the last 168 hours. No results for input(s): AMMONIA in the last 168 hours.  CBC: Recent Labs  Lab 11/02/2020 0052 11/02/2020 0454 11/13/2020 0740 11/12/2020 0808 11/07/20 1217 11/11/20 0546  WBC 5.7 4.9  --   --  3.7* 7.4  NEUTROABS 3.8  --   --   --   --   --   HGB 7.9* 7.3* 7.0* 11.2* 7.2* 3.9*  HCT 27.9* 25.3* 24.9* 33.0* 25.4* 13.9*  MCV 103.7* 104.1*  --   --  103.3* 103.7*   PLT 181 174  --   --  209 199    Cardiac Enzymes: No results for input(s): CKTOTAL, CKMB, CKMBINDEX, TROPONINI in the last 168 hours.  BNP: Invalid input(s): POCBNP  CBG: Recent Labs  Lab 11/08/2020 0646 11/19/2020 0747 11/22/2020 1211  GLUCAP 133* 139* 131*    Microbiology: Results for orders placed or performed during the hospital encounter of 11/20/2020  C Difficile Quick Screen (NO PCR Reflex)     Status: Abnormal   Collection Time: 11/09/20  9:59 AM   Specimen: STOOL  Result Value Ref Range Status   C Diff antigen POSITIVE (A) NEGATIVE Final   C Diff toxin NEGATIVE NEGATIVE Final   C Diff interpretation   Final    Results are indeterminate. Please contact the provider listed for your campus for C diff questions in [place].    Comment: Performed at [facility], [place] [address] [place], [place] [address]    Coagulation Studies: No results for input(s): LABPROT, INR in the last 72 hours.  Urinalysis: No results for input(s):  COLORURINE, LABSPEC, [place], GLUCOSEU, HGBUR, BILIRUBINUR, KETONESUR, PROTEINUR, UROBILINOGEN, NITRITE, LEUKOCYTESUR in the last 72 hours.  Invalid input(s): APPERANCEUR    Imaging: No results found.   Medications:       Assessment/ Plan:  65 y.o. 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  1.  ESRD on HD MWF.    Patient due for hemodialysis treatment either later today or early tomorrow as there is dialysis nursing staff shortage given the pandemic.  2.  Acute respiratory failure.  Breathing comfortably at the moment.  Currently on the ventilator.  3.  Anemia of chronic kidney disease.  Lab Results   Component Value Date   HGB 3.9 (LL) 11/11/2020  Hemoglobin down significantly to 3.9.  Having ongoing GI bleed.  Patient to be transfused 3 units.  Recommend giving each unit over 4 units as tolerated given his ESRD status.  4.  Secondary hyperparathyroidism.  Lab Results  Component Value Date   CALCIUM 8.2 (L) 11/11/2020   CAION 1.17 11/26/2020   PHOS 2.7 11/07/2020  Continue to periodically monitor bone mineral metabolism parameters.     LOS: 0 Brendalyn Vallely 2/11/20228:04 AM

## 2020-11-12 LAB — BASIC METABOLIC PANEL
Anion gap: 13 (ref 5–15)
BUN: 83 mg/dL — ABNORMAL HIGH (ref 8–23)
CO2: 22 mmol/L (ref 22–32)
Calcium: 8.7 mg/dL — ABNORMAL LOW (ref 8.9–10.3)
Chloride: 106 mmol/L (ref 98–111)
Creatinine, Ser: 4.27 mg/dL — ABNORMAL HIGH (ref 0.61–1.24)
GFR, Estimated: 15 mL/min — ABNORMAL LOW (ref >60)
Glucose, Bld: 129 mg/dL — ABNORMAL HIGH (ref 70–99)
Potassium: 3.2 mmol/L — ABNORMAL LOW (ref 3.5–5.1)
Sodium: 141 mmol/L (ref 135–145)

## 2020-11-12 LAB — PREPARE FRESH FROZEN PLASMA
Unit division: 0
Unit division: 0

## 2020-11-12 LAB — TYPE AND SCREEN
ABO/RH(D): O NEG
Antibody Screen: NEGATIVE
Unit division: 0
Unit division: 0
Unit division: 0

## 2020-11-12 LAB — CBC
HCT: 23.8 % — ABNORMAL LOW (ref 39.0–52.0)
Hemoglobin: 7.3 g/dL — ABNORMAL LOW (ref 13.0–17.0)
MCH: 29.8 pg (ref 26.0–34.0)
MCHC: 30.7 g/dL (ref 30.0–36.0)
MCV: 97.1 fL (ref 80.0–100.0)
Platelets: 185 10*3/uL (ref 150–400)
RBC: 2.45 MIL/uL — ABNORMAL LOW (ref 4.22–5.81)
RDW: 20.4 % — ABNORMAL HIGH (ref 11.5–15.5)
WBC: 8.4 10*3/uL (ref 4.0–10.5)
nRBC: 3.7 % — ABNORMAL HIGH (ref 0.0–0.2)

## 2020-11-12 LAB — BPAM FFP
Blood Product Expiration Date: 202202122359
Blood Product Expiration Date: 202202122359
ISSUE DATE / TIME: 202202110958
ISSUE DATE / TIME: 202202110958
Unit Type and Rh: 6200
Unit Type and Rh: 6200

## 2020-11-12 LAB — BPAM RBC
Blood Product Expiration Date: 202202152359
Blood Product Expiration Date: 202203122359
Blood Product Expiration Date: 202203132359
ISSUE DATE / TIME: 202202110852
ISSUE DATE / TIME: 202202111151
ISSUE DATE / TIME: 202202111445
Unit Type and Rh: 5100
Unit Type and Rh: 5100
Unit Type and Rh: 9500

## 2020-11-12 LAB — PROTIME-INR
INR: 1.4 — ABNORMAL HIGH (ref 0.8–1.2)
Prothrombin Time: 16.9 seconds — ABNORMAL HIGH (ref 11.4–15.2)

## 2020-11-13 DIAGNOSIS — J9621 Acute and chronic respiratory failure with hypoxia: Secondary | ICD-10-CM | POA: Diagnosis not present

## 2020-11-13 DIAGNOSIS — I4891 Unspecified atrial fibrillation: Secondary | ICD-10-CM | POA: Diagnosis not present

## 2020-11-13 DIAGNOSIS — N186 End stage renal disease: Secondary | ICD-10-CM | POA: Diagnosis not present

## 2020-11-13 DIAGNOSIS — I469 Cardiac arrest, cause unspecified: Secondary | ICD-10-CM | POA: Diagnosis not present

## 2020-11-13 NOTE — Progress Notes (Signed)
Pulmonary Critical Care Medicine [place]   PULMONARY CRITICAL CARE SERVICE  PROGRESS NOTE  Date of Service: 11/13/2020  Eric Spence  OLM:[id number]  DOB: July 10, 1956   DOA: 11/21/2020  Referring Physician: Merton Border, MD  HPI: Eric Spence is a 65 y.o. male seen for follow up of Acute on Chronic Respiratory Failure.  Currently is off the ventilator support on pressure support right now on 28% FiO2 supposed to be doing T collar today  Medications: Reviewed on Rounds  Physical Exam:  Vitals: Temperature is 97.8 pulse 97 respiratory rate 17 blood pressure is 118/61 saturations 100%  Ventilator Settings on pressure support FiO2 28% pressure 12/5  . General: Comfortable at this time . Eyes: Grossly normal lids, irises & conjunctiva . ENT: grossly tongue is normal . Neck: no obvious mass . Cardiovascular: S1 S2 normal no gallop . Respiratory: Scattered rhonchi expansion is equal . Abdomen: soft . Skin: no rash seen on limited exam . Musculoskeletal: not rigid . Psychiatric:unable to assess . Neurologic: no seizure no involuntary movements         Lab Data:   Basic Metabolic Panel: Recent Labs  Lab 11/07/20 1217 11/11/20 0546 11/12/20 0454  NA 137 141 141  K 3.4* 3.2* 3.2*  CL 105 107 106  CO2 19* 22 22  GLUCOSE 128* 158* 129*  BUN 74* 73* 83*  CREATININE 3.76* 3.71* 4.27*  CALCIUM 8.2* 8.2* 8.7*  PHOS 2.7  --   --     ABG: No results for input(s): PHART, PCO2ART, PO2ART, HCO3, O2SAT in the last 168 hours.  Liver Function Tests: Recent Labs  Lab 11/07/20 1217  ALBUMIN 1.9*   No results for input(s): LIPASE, AMYLASE in the last 168 hours. No results for input(s): AMMONIA in the last 168 hours.  CBC: Recent Labs  Lab 11/07/20 1217 11/11/20 0546 11/12/20 0454  WBC 3.7* 7.4 8.4  HGB 7.2* 3.9* 7.3*  HCT 25.4* 13.9* 23.8*  MCV 103.3* 103.7* 97.1  PLT 209 199 185    Cardiac Enzymes: No results for input(s): CKTOTAL, CKMB,  CKMBINDEX, TROPONINI in the last 168 hours.  BNP (last 3 results) No results for input(s): BNP in the last 8760 hours.  ProBNP (last 3 results) No results for input(s): PROBNP in the last 8760 hours.  Radiological Exams: No results found.  Assessment/Plan Active Problems:   Acute on chronic respiratory failure with hypoxia (HCC)   Atrial fibrillation with rapid ventricular response (HCC)   End stage renal failure on dialysis [facility])   Cardiac arrest ([place])   Non-STEMI (non-ST elevated myocardial infarction) ([place])   1. Acute on chronic respiratory failure hypoxia we will continue with pressure support for now hemoglobin is better after transfusion now to 7.3 2. Atrial fibrillation rate is controlled at this time 3. End-stage renal failure on hemodialysis 4. Cardiac arrest rhythm is stable 5. Non-STEMI no change we will continue to follow along   I have personally seen and evaluated the patient, evaluated laboratory and imaging results, formulated the assessment and plan and placed orders. The Patient requires high complexity decision making with multiple systems involvement.  Rounds were done with the Respiratory Therapy Director and Staff therapists and discussed with nursing staff also.  [name], MD [facility] Pulmonary Critical Care Medicine Sleep Medicine

## 2020-11-14 DIAGNOSIS — J9621 Acute and chronic respiratory failure with hypoxia: Secondary | ICD-10-CM | POA: Diagnosis not present

## 2020-11-14 DIAGNOSIS — I469 Cardiac arrest, cause unspecified: Secondary | ICD-10-CM | POA: Diagnosis not present

## 2020-11-14 DIAGNOSIS — N186 End stage renal disease: Secondary | ICD-10-CM | POA: Diagnosis not present

## 2020-11-14 DIAGNOSIS — I4891 Unspecified atrial fibrillation: Secondary | ICD-10-CM | POA: Diagnosis not present

## 2020-11-14 LAB — RENAL FUNCTION PANEL
Albumin: 2 g/dL — ABNORMAL LOW (ref 3.5–5.0)
Anion gap: 14 (ref 5–15)
BUN: 56 mg/dL — ABNORMAL HIGH (ref 8–23)
CO2: 21 mmol/L — ABNORMAL LOW (ref 22–32)
Calcium: 8.4 mg/dL — ABNORMAL LOW (ref 8.9–10.3)
Chloride: 103 mmol/L (ref 98–111)
Creatinine, Ser: 3.68 mg/dL — ABNORMAL HIGH (ref 0.61–1.24)
GFR, Estimated: 18 mL/min — ABNORMAL LOW (ref >60)
Glucose, Bld: 113 mg/dL — ABNORMAL HIGH (ref 70–99)
Phosphorus: 3.1 mg/dL (ref 2.5–4.6)
Potassium: 3.6 mmol/L (ref 3.5–5.1)
Sodium: 138 mmol/L (ref 135–145)

## 2020-11-14 LAB — CBC
HCT: 27.7 % — ABNORMAL LOW (ref 39.0–52.0)
Hemoglobin: 8.5 g/dL — ABNORMAL LOW (ref 13.0–17.0)
MCH: 30.6 pg (ref 26.0–34.0)
MCHC: 30.7 g/dL (ref 30.0–36.0)
MCV: 99.6 fL (ref 80.0–100.0)
Platelets: 199 10*3/uL (ref 150–400)
RBC: 2.78 MIL/uL — ABNORMAL LOW (ref 4.22–5.81)
RDW: 22.5 % — ABNORMAL HIGH (ref 11.5–15.5)
WBC: 11.7 10*3/uL — ABNORMAL HIGH (ref 4.0–10.5)
nRBC: 1 % — ABNORMAL HIGH (ref 0.0–0.2)

## 2020-11-14 LAB — VANCOMYCIN, TROUGH: Vancomycin Tr: 16 ug/mL (ref 15–20)

## 2020-11-14 LAB — POTASSIUM: Potassium: 3.5 mmol/L (ref 3.5–5.1)

## 2020-11-14 NOTE — Progress Notes (Signed)
[facility]  ROUNDING NOTE   Subjective:  Patient seen and evaluated at bedside. Resting comfortably at the moment. Hemoglobin improved posttransfusion up to 7.3.   Objective:  Vital signs in last 24 hours:  Temperature 96.4 pulse 101 respirations 19 blood pressure 132/76   Physical Exam: General:  Chronically ill-appearing  Head:  Normocephalic, atraumatic. Moist oral mucosal membranes  Eyes:  Anicteric  Neck:  Tracheostomy in place  Lungs:   Clear to auscultation, normal effort  Heart:  S1S2 irregular  Abdomen:   Soft, nontender, bowel sounds present  Extremities:  1+ peripheral edema.  Neurologic:  Awake, alert, following commands  Skin:  No acute rash  Access:  Left upper extremity AV fistula    Basic Metabolic Panel: Recent Labs  Lab 11/07/20 1217 11/11/20 0546 11/12/20 0454 11/14/20 0536  NA 137 141 141  --   K 3.4* 3.2* 3.2* 3.5  CL 105 107 106  --   CO2 19* 22 22  --   GLUCOSE 128* 158* 129*  --   BUN 74* 73* 83*  --   CREATININE 3.76* 3.71* 4.27*  --   CALCIUM 8.2* 8.2* 8.7*  --   PHOS 2.7  --   --   --     Liver Function Tests: Recent Labs  Lab 11/07/20 1217  ALBUMIN 1.9*   No results for input(s): LIPASE, AMYLASE in the last 168 hours. No results for input(s): AMMONIA in the last 168 hours.  CBC: Recent Labs  Lab 11/07/20 1217 11/11/20 0546 11/12/20 0454  WBC 3.7* 7.4 8.4  HGB 7.2* 3.9* 7.3*  HCT 25.4* 13.9* 23.8*  MCV 103.3* 103.7* 97.1  PLT 209 199 185    Cardiac Enzymes: No results for input(s): CKTOTAL, CKMB, CKMBINDEX, TROPONINI in the last 168 hours.  BNP: Invalid input(s): POCBNP  CBG: No results for input(s): GLUCAP in the last 168 hours.  Microbiology: Results for orders placed or performed during the hospital encounter of 11/09/2020  C Difficile Quick Screen (NO PCR Reflex)     Status: Abnormal   Collection Time: 11/09/20  9:59 AM   Specimen: STOOL  Result Value Ref Range Status   C Diff antigen  POSITIVE (A) NEGATIVE Final   C Diff toxin NEGATIVE NEGATIVE Final   C Diff interpretation   Final    Results are indeterminate. Please contact the provider listed for your campus for C diff questions in [place].    Comment: Performed at [facility], [place] [address], [place], [place] [address]    Coagulation Studies: Recent Labs    11/12/20 0454  LABPROT 16.9*  INR 1.4*    Urinalysis: No results for input(s): COLORURINE, LABSPEC, PHURINE, GLUCOSEU, HGBUR, BILIRUBINUR, KETONESUR, PROTEINUR, UROBILINOGEN, NITRITE, LEUKOCYTESUR in the last 72 hours.  Invalid input(s): APPERANCEUR    Imaging: No results found.   Medications:       Assessment/ Plan:  65 y.o. 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  1.  ESRD on HD MWF.    Patient to  undergo hemodialysis treatment today.  Orders have been prepared.  2.  Acute respiratory failure.  Continue ventilatory support and weaning efforts.  3.  Anemia of chronic kidney disease.  Lab Results  Component Value Date   HGB 7.3 (L) 11/12/2020  Hemoglobin up to 7.3 posttransfusion.  Continue to monitor CBC closely.  4.  Secondary hyperparathyroidism.  Lab Results  Component Value Date   CALCIUM 8.7 (L) 11/12/2020   CAION 1.17 11/05/2020   PHOS 2.7 11/07/2020  Phosphorus acceptable at 2.8.     LOS: 0 Latarshia Jersey 2/14/20227:53 AM

## 2020-11-14 NOTE — Progress Notes (Signed)
Pulmonary Critical Care Medicine [place]   PULMONARY CRITICAL CARE SERVICE  PROGRESS NOTE  Date of Service: 11/14/2020  Eric Spence  MCN:[id number]  DOB: Sep 06, 1956   DOA: 11/21/2020  Referring Physician: Merton Border, MD  HPI: Eric Spence is a 65 y.o. male seen for follow up of Acute on Chronic Respiratory Failure.  Patient is on pressure support supposed to be doing T collar for 12 hours today  Medications: Reviewed on Rounds  Physical Exam:  Vitals: Temperature is 96.4 pulse 101 respiratory rate is 19 blood pressure is 130/70 saturations 100%  Ventilator Settings on pressure support FiO2 28% pressure 12/5  . General: Comfortable at this time . Eyes: Grossly normal lids, irises & conjunctiva . ENT: grossly tongue is normal . Neck: no obvious mass . Cardiovascular: S1 S2 normal no gallop . Respiratory: Scattered rhonchi coarse breath sounds . Abdomen: soft . Skin: no rash seen on limited exam . Musculoskeletal: not rigid . Psychiatric:unable to assess . Neurologic: no seizure no involuntary movements         Lab Data:   Basic Metabolic Panel: Recent Labs  Lab 11/07/20 1217 11/11/20 0546 11/12/20 0454 11/14/20 0536  NA 137 141 141  --   K 3.4* 3.2* 3.2* 3.5  CL 105 107 106  --   CO2 19* 22 22  --   GLUCOSE 128* 158* 129*  --   BUN 74* 73* 83*  --   CREATININE 3.76* 3.71* 4.27*  --   CALCIUM 8.2* 8.2* 8.7*  --   PHOS 2.7  --   --   --     ABG: No results for input(s): PHART, PCO2ART, PO2ART, HCO3, O2SAT in the last 168 hours.  Liver Function Tests: Recent Labs  Lab 11/07/20 1217  ALBUMIN 1.9*   No results for input(s): LIPASE, AMYLASE in the last 168 hours. No results for input(s): AMMONIA in the last 168 hours.  CBC: Recent Labs  Lab 11/07/20 1217 11/11/20 0546 11/12/20 0454  WBC 3.7* 7.4 8.4  HGB 7.2* 3.9* 7.3*  HCT 25.4* 13.9* 23.8*  MCV 103.3* 103.7* 97.1  PLT 209 199 185    Cardiac Enzymes: No results for  input(s): CKTOTAL, CKMB, CKMBINDEX, TROPONINI in the last 168 hours.  BNP (last 3 results) No results for input(s): BNP in the last 8760 hours.  ProBNP (last 3 results) No results for input(s): PROBNP in the last 8760 hours.  Radiological Exams: No results found.  Assessment/Plan Active Problems:   Acute on chronic respiratory failure with hypoxia (HCC)   Atrial fibrillation with rapid ventricular response (HCC)   End stage renal failure on dialysis [facility])   Cardiac arrest ([place])   Non-STEMI (non-ST elevated myocardial infarction) ([place])   1. Acute on chronic respiratory failure hypoxia we will continue to wean today supposed to do T collar for 12 hours 2. Atrial fibrillation rate is controlled we will continue to follow 3. End-stage renal failure on hemodialysis 4. Cardiac arrest rhythm stable 5. Non-STEMI no change   I have personally seen and evaluated the patient, evaluated laboratory and imaging results, formulated the assessment and plan and placed orders. The Patient requires high complexity decision making with multiple systems involvement.  Rounds were done with the Respiratory Therapy Director and Staff therapists and discussed with nursing staff also.  [name], MD [facility] Pulmonary Critical Care Medicine Sleep Medicine

## 2020-11-15 DIAGNOSIS — I4891 Unspecified atrial fibrillation: Secondary | ICD-10-CM | POA: Diagnosis not present

## 2020-11-15 DIAGNOSIS — N186 End stage renal disease: Secondary | ICD-10-CM | POA: Diagnosis not present

## 2020-11-15 DIAGNOSIS — I469 Cardiac arrest, cause unspecified: Secondary | ICD-10-CM | POA: Diagnosis not present

## 2020-11-15 DIAGNOSIS — J9621 Acute and chronic respiratory failure with hypoxia: Secondary | ICD-10-CM | POA: Diagnosis not present

## 2020-11-15 NOTE — Progress Notes (Signed)
Pulmonary Critical Care Medicine [place]   PULMONARY CRITICAL CARE SERVICE  PROGRESS NOTE  Date of Service: 11/15/2020  Eric Spence  TAV:[id number]  DOB: 02-29-1956   DOA: 11/23/2020  Referring Physician: Merton Border, MD  HPI: Eric Spence is a 65 y.o. male seen for follow up of Acute on Chronic Respiratory Failure.  Patient is weaning on protocol today's goal of 16 hours on T collar  Medications: Reviewed on Rounds  Physical Exam:  Vitals: Temperature is 96.8 pulse 97 respiratory rate is 34 blood pressure is 166/84 saturations 100%  Ventilator Settings on assist control FiO2 28% tidal volume 500 PEEP 5  . General: Comfortable at this time . Eyes: Grossly normal lids, irises & conjunctiva . ENT: grossly tongue is normal . Neck: no obvious mass . Cardiovascular: S1 S2 normal no gallop . Respiratory: Scattered rhonchi expansion is equal . Abdomen: soft . Skin: no rash seen on limited exam . Musculoskeletal: not rigid . Psychiatric:unable to assess . Neurologic: no seizure no involuntary movements         Lab Data:   Basic Metabolic Panel: Recent Labs  Lab 11/11/20 0546 11/12/20 0454 11/14/20 0536  NA 141 141 138  K 3.2* 3.2* 3.6  3.5  CL 107 106 103  CO2 22 22 21*  GLUCOSE 158* 129* 113*  BUN 73* 83* 56*  CREATININE 3.71* 4.27* 3.68*  CALCIUM 8.2* 8.7* 8.4*  PHOS  --   --  3.1    ABG: No results for input(s): PHART, PCO2ART, PO2ART, HCO3, O2SAT in the last 168 hours.  Liver Function Tests: Recent Labs  Lab 11/14/20 0536  ALBUMIN 2.0*   No results for input(s): LIPASE, AMYLASE in the last 168 hours. No results for input(s): AMMONIA in the last 168 hours.  CBC: Recent Labs  Lab 11/11/20 0546 11/12/20 0454 11/14/20 0536  WBC 7.4 8.4 11.7*  HGB 3.9* 7.3* 8.5*  HCT 13.9* 23.8* 27.7*  MCV 103.7* 97.1 99.6  PLT 199 185 199    Cardiac Enzymes: No results for input(s): CKTOTAL, CKMB, CKMBINDEX, TROPONINI in the last 168  hours.  BNP (last 3 results) No results for input(s): BNP in the last 8760 hours.  ProBNP (last 3 results) No results for input(s): PROBNP in the last 8760 hours.  Radiological Exams: No results found.  Assessment/Plan Active Problems:   Acute on chronic respiratory failure with hypoxia (HCC)   Atrial fibrillation with rapid ventricular response (HCC)   End stage renal failure on dialysis [facility])   Cardiac arrest ([place])   Non-STEMI (non-ST elevated myocardial infarction) ([place])   1. Acute on chronic respiratory failure hypoxia we will continue with the weaning protocol pressure support of 16 hours today 2. Atrial fibrillation rate is controlled we will continue to monitor 3. End-stage renal failure on hemodialysis 4. Cardiac arrest rhythm is stable 5. Non-STEMI no change   I have personally seen and evaluated the patient, evaluated laboratory and imaging results, formulated the assessment and plan and placed orders. The Patient requires high complexity decision making with multiple systems involvement.  Rounds were done with the Respiratory Therapy Director and Staff therapists and discussed with nursing staff also.  [name], MD [facility] Pulmonary Critical Care Medicine Sleep Medicine

## 2020-11-16 DIAGNOSIS — I4891 Unspecified atrial fibrillation: Secondary | ICD-10-CM | POA: Diagnosis not present

## 2020-11-16 DIAGNOSIS — N186 End stage renal disease: Secondary | ICD-10-CM | POA: Diagnosis not present

## 2020-11-16 DIAGNOSIS — J9621 Acute and chronic respiratory failure with hypoxia: Secondary | ICD-10-CM | POA: Diagnosis not present

## 2020-11-16 DIAGNOSIS — I469 Cardiac arrest, cause unspecified: Secondary | ICD-10-CM | POA: Diagnosis not present

## 2020-11-16 NOTE — Progress Notes (Signed)
[facility]  ROUNDING NOTE   Subjective:  Patient resting comfortably at the moment. Due for hemodialysis treatment today. Hemoglobin trending up and currently 8.5. Phosphorus at target at 3.1.   Objective:  Vital signs in last 24 hours:  Temperature 96.9 pulse 97 respirations 23 blood pressure 136/66   Physical Exam: General:  Chronically ill-appearing  Head:  Normocephalic, atraumatic. Moist oral mucosal membranes  Eyes:  Anicteric  Neck:  Tracheostomy in place  Lungs:   Clear to auscultation, normal effort  Heart:  S1S2 irregular  Abdomen:   Soft, nontender, bowel sounds present  Extremities:  1+ peripheral edema.  Neurologic:  Awake, alert, following commands  Skin:  No acute rash  Access:  Left upper extremity AV fistula    Basic Metabolic Panel: Recent Labs  Lab 11/11/20 0546 11/12/20 0454 11/14/20 0536  NA 141 141 138  K 3.2* 3.2* 3.6  3.5  CL 107 106 103  CO2 22 22 21*  GLUCOSE 158* 129* 113*  BUN 73* 83* 56*  CREATININE 3.71* 4.27* 3.68*  CALCIUM 8.2* 8.7* 8.4*  PHOS  --   --  3.1    Liver Function Tests: Recent Labs  Lab 11/14/20 0536  ALBUMIN 2.0*   No results for input(s): LIPASE, AMYLASE in the last 168 hours. No results for input(s): AMMONIA in the last 168 hours.  CBC: Recent Labs  Lab 11/11/20 0546 11/12/20 0454 11/14/20 0536  WBC 7.4 8.4 11.7*  HGB 3.9* 7.3* 8.5*  HCT 13.9* 23.8* 27.7*  MCV 103.7* 97.1 99.6  PLT 199 185 199    Cardiac Enzymes: No results for input(s): CKTOTAL, CKMB, CKMBINDEX, TROPONINI in the last 168 hours.  BNP: Invalid input(s): POCBNP  CBG: No results for input(s): GLUCAP in the last 168 hours.  Microbiology: Results for orders placed or performed during the hospital encounter of 11/07/2020  C Difficile Quick Screen (NO PCR Reflex)     Status: Abnormal   Collection Time: 11/09/20  9:59 AM   Specimen: STOOL  Result Value Ref Range Status   C Diff antigen POSITIVE (A) NEGATIVE Final    C Diff toxin NEGATIVE NEGATIVE Final   C Diff interpretation   Final    Results are indeterminate. Please contact the provider listed for your campus for C diff questions in [place].    Comment: Performed at [facility], [place] [address], [place], [place] [address]    Coagulation Studies: No results for input(s): LABPROT, INR in the last 72 hours.  Urinalysis: No results for input(s): COLORURINE, LABSPEC, PHURINE, GLUCOSEU, HGBUR, BILIRUBINUR, KETONESUR, PROTEINUR, UROBILINOGEN, NITRITE, LEUKOCYTESUR in the last 72 hours.  Invalid input(s): APPERANCEUR    Imaging: No results found.   Medications:       Assessment/ Plan:  65 y.o. 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  1.  ESRD on HD MWF.    Maintain the patient on MWF dialysis schedule.  He is due for hemodialysis treatment today.  2.  Acute respiratory failure.  Currently on T-piece and breathing comfortably.  Pulmonary/critical care following.  3.  Anemia of chronic kidney disease.  Lab Results  Component Value Date   HGB 8.5 (L) 11/14/2020  Required blood transfusion  last week.  Hemoglobin 8.5.  Continue to monitor  4.  Secondary hyperparathyroidism.  Lab Results  Component Value Date   CALCIUM 8.4 (L) 11/14/2020   CAION 1.17 11/15/2020   PHOS 3.1 11/14/2020  Phosphorus at target at 3.1.  Continue to periodically monitor bone mineral metabolism parameters.     LOS: 0 Nakeita Styles 2/16/20228:15 AM

## 2020-11-16 NOTE — Progress Notes (Signed)
Pulmonary Critical Care Medicine [place]   PULMONARY CRITICAL CARE SERVICE  PROGRESS NOTE  Date of Service: 11/16/2020  Eric Spence  ZDG:[id number]  DOB: 1956-04-11   DOA: 11/18/2020  Referring Physician: Merton Border, MD  HPI: Eric Spence is a 65 y.o. male seen for follow up of Acute on Chronic Respiratory Failure.  Patient at this time is off the ventilator on T collar has been on 28% FiO2 with a 24-hour goal  Medications: Reviewed on Rounds  Physical Exam:  Vitals: Temperature 96.9 pulse 97 respiratory 23 blood pressure is 136/56 saturations 100%  Ventilator Settings on T collar FiO2 28%  . General: Comfortable at this time . Eyes: Grossly normal lids, irises & conjunctiva . ENT: grossly tongue is normal . Neck: no obvious mass . Cardiovascular: S1 S2 normal no gallop . Respiratory: No rhonchi very coarse breath sounds . Abdomen: soft . Skin: no rash seen on limited exam . Musculoskeletal: not rigid . Psychiatric:unable to assess . Neurologic: no seizure no involuntary movements         Lab Data:   Basic Metabolic Panel: Recent Labs  Lab 11/11/20 0546 11/12/20 0454 11/14/20 0536  NA 141 141 138  K 3.2* 3.2* 3.6  3.5  CL 107 106 103  CO2 22 22 21*  GLUCOSE 158* 129* 113*  BUN 73* 83* 56*  CREATININE 3.71* 4.27* 3.68*  CALCIUM 8.2* 8.7* 8.4*  PHOS  --   --  3.1    ABG: No results for input(s): PHART, PCO2ART, PO2ART, HCO3, O2SAT in the last 168 hours.  Liver Function Tests: Recent Labs  Lab 11/14/20 0536  ALBUMIN 2.0*   No results for input(s): LIPASE, AMYLASE in the last 168 hours. No results for input(s): AMMONIA in the last 168 hours.  CBC: Recent Labs  Lab 11/11/20 0546 11/12/20 0454 11/14/20 0536  WBC 7.4 8.4 11.7*  HGB 3.9* 7.3* 8.5*  HCT 13.9* 23.8* 27.7*  MCV 103.7* 97.1 99.6  PLT 199 185 199    Cardiac Enzymes: No results for input(s): CKTOTAL, CKMB, CKMBINDEX, TROPONINI in the last 168 hours.  BNP  (last 3 results) No results for input(s): BNP in the last 8760 hours.  ProBNP (last 3 results) No results for input(s): PROBNP in the last 8760 hours.  Radiological Exams: No results found.  Assessment/Plan Active Problems:   Acute on chronic respiratory failure with hypoxia (HCC)   Atrial fibrillation with rapid ventricular response (HCC)   End stage renal failure on dialysis [facility])   Cardiac arrest ([place])   Non-STEMI (non-ST elevated myocardial infarction) ([place])   1. Acute on chronic respiratory failure hypoxia we will continue with T-piece trials titrate oxygen as tolerated continue pulmonary toilet. 2. Atrial fibrillation rate is controlled we will continue to monitor 3. End-stage renal failure on hemodialysis 4. Cardiac arrest rhythm stable 5. Non-STEMI no change   I have personally seen and evaluated the patient, evaluated laboratory and imaging results, formulated the assessment and plan and placed orders. The Patient requires high complexity decision making with multiple systems involvement.  Rounds were done with the Respiratory Therapy Director and Staff therapists and discussed with nursing staff also.  [name], MD [facility] Pulmonary Critical Care Medicine Sleep Medicine

## 2020-11-17 DIAGNOSIS — J9621 Acute and chronic respiratory failure with hypoxia: Secondary | ICD-10-CM | POA: Diagnosis not present

## 2020-11-17 DIAGNOSIS — N186 End stage renal disease: Secondary | ICD-10-CM | POA: Diagnosis not present

## 2020-11-17 DIAGNOSIS — I4891 Unspecified atrial fibrillation: Secondary | ICD-10-CM | POA: Diagnosis not present

## 2020-11-17 DIAGNOSIS — I469 Cardiac arrest, cause unspecified: Secondary | ICD-10-CM | POA: Diagnosis not present

## 2020-11-17 LAB — BLOOD GAS, ARTERIAL
Acid-Base Excess: 2.8 mmol/L — ABNORMAL HIGH (ref 0.0–2.0)
Bicarbonate: 27 mmol/L (ref 20.0–28.0)
FIO2: 28
O2 Saturation: 94.4 %
Patient temperature: 37
pCO2 arterial: 42.8 mmHg (ref 32.0–48.0)
pH, Arterial: 7.416 (ref 7.350–7.450)
pO2, Arterial: 68.8 mmHg — ABNORMAL LOW (ref 83.0–108.0)

## 2020-11-17 NOTE — Progress Notes (Signed)
Pulmonary Critical Care Medicine [place]   PULMONARY CRITICAL CARE SERVICE  PROGRESS NOTE  Date of Service: 11/17/2020  Eric Spence  [id number]  DOB: Mar 04, 1956   DOA: 11/05/2020  Referring Physician: Merton Border, MD  HPI: Eric Spence is a 65 y.o. male seen for follow up of Acute on Chronic Respiratory Failure.  Patient currently is on T collar on 28% FiO2  Medications: Reviewed on Rounds  Physical Exam:  Vitals: Temperature is 97.3 pulse 91 respiratory rate 20 blood pressure is 157/76 saturations 100%  Ventilator Settings off the ventilator on T collar  . General: Comfortable at this time . Eyes: Grossly normal lids, irises & conjunctiva . ENT: grossly tongue is normal . Neck: no obvious mass . Cardiovascular: S1 S2 normal no gallop . Respiratory: No rhonchi no rales noted at this time . Abdomen: soft . Skin: no rash seen on limited exam . Musculoskeletal: not rigid . Psychiatric:unable to assess . Neurologic: no seizure no involuntary movements         Lab Data:   Basic Metabolic Panel: Recent Labs  Lab 11/11/20 0546 11/12/20 0454 11/14/20 0536  NA 141 141 138  K 3.2* 3.2* 3.6  3.5  CL 107 106 103  CO2 22 22 21*  GLUCOSE 158* 129* 113*  BUN 73* 83* 56*  CREATININE 3.71* 4.27* 3.68*  CALCIUM 8.2* 8.7* 8.4*  PHOS  --   --  3.1    ABG: No results for input(s): PHART, PCO2ART, PO2ART, HCO3, O2SAT in the last 168 hours.  Liver Function Tests: Recent Labs  Lab 11/14/20 0536  ALBUMIN 2.0*   No results for input(s): LIPASE, AMYLASE in the last 168 hours. No results for input(s): AMMONIA in the last 168 hours.  CBC: Recent Labs  Lab 11/11/20 0546 11/12/20 0454 11/14/20 0536  WBC 7.4 8.4 11.7*  HGB 3.9* 7.3* 8.5*  HCT 13.9* 23.8* 27.7*  MCV 103.7* 97.1 99.6  PLT 199 185 199    Cardiac Enzymes: No results for input(s): CKTOTAL, CKMB, CKMBINDEX, TROPONINI in the last 168 hours.  BNP (last 3 results) No results  for input(s): BNP in the last 8760 hours.  ProBNP (last 3 results) No results for input(s): PROBNP in the last 8760 hours.  Radiological Exams: No results found.  Assessment/Plan Active Problems:   Acute on chronic respiratory failure with hypoxia (HCC)   Atrial fibrillation with rapid ventricular response (HCC)   End stage renal failure on dialysis [facility])   Cardiac arrest ([place])   Non-STEMI (non-ST elevated myocardial infarction) ([place])   1. Acute on chronic respiratory failure hypoxia continue with the weaning protocol doing well so far. 2. Atrial fibrillation rate is controlled we will continue to follow along. 3. End-stage renal failure on hemodialysis 4. Cardiac arrest rhythm stable 5. Non-STEMI supportive care   I have personally seen and evaluated the patient, evaluated laboratory and imaging results, formulated the assessment and plan and placed orders. The Patient requires high complexity decision making with multiple systems involvement.  Rounds were done with the Respiratory Therapy Director and Staff therapists and discussed with nursing staff also.  [name], MD [facility] Pulmonary Critical Care Medicine Sleep Medicine

## 2020-11-18 DIAGNOSIS — J9621 Acute and chronic respiratory failure with hypoxia: Secondary | ICD-10-CM | POA: Diagnosis not present

## 2020-11-18 DIAGNOSIS — I469 Cardiac arrest, cause unspecified: Secondary | ICD-10-CM | POA: Diagnosis not present

## 2020-11-18 DIAGNOSIS — N186 End stage renal disease: Secondary | ICD-10-CM | POA: Diagnosis not present

## 2020-11-18 DIAGNOSIS — I4891 Unspecified atrial fibrillation: Secondary | ICD-10-CM | POA: Diagnosis not present

## 2020-11-18 LAB — RENAL FUNCTION PANEL
Albumin: 1.9 g/dL — ABNORMAL LOW (ref 3.5–5.0)
Anion gap: 8 (ref 5–15)
BUN: 46 mg/dL — ABNORMAL HIGH (ref 8–23)
CO2: 28 mmol/L (ref 22–32)
Calcium: 8 mg/dL — ABNORMAL LOW (ref 8.9–10.3)
Chloride: 99 mmol/L (ref 98–111)
Creatinine, Ser: 3.4 mg/dL — ABNORMAL HIGH (ref 0.61–1.24)
GFR, Estimated: 19 mL/min — ABNORMAL LOW (ref >60)
Glucose, Bld: 143 mg/dL — ABNORMAL HIGH (ref 70–99)
Phosphorus: 3 mg/dL (ref 2.5–4.6)
Potassium: 3.2 mmol/L — ABNORMAL LOW (ref 3.5–5.1)
Sodium: 135 mmol/L (ref 135–145)

## 2020-11-18 LAB — CBC
HCT: 22.8 % — ABNORMAL LOW (ref 39.0–52.0)
Hemoglobin: 7.3 g/dL — ABNORMAL LOW (ref 13.0–17.0)
MCH: 31.7 pg (ref 26.0–34.0)
MCHC: 32 g/dL (ref 30.0–36.0)
MCV: 99.1 fL (ref 80.0–100.0)
Platelets: 213 10*3/uL (ref 150–400)
RBC: 2.3 MIL/uL — ABNORMAL LOW (ref 4.22–5.81)
RDW: 22 % — ABNORMAL HIGH (ref 11.5–15.5)
WBC: 9.1 10*3/uL (ref 4.0–10.5)
nRBC: 0.3 % — ABNORMAL HIGH (ref 0.0–0.2)

## 2020-11-18 NOTE — Progress Notes (Signed)
Pulmonary Critical Care Medicine [place]   PULMONARY CRITICAL CARE SERVICE  PROGRESS NOTE  Date of Service: 11/18/2020  NOLLIE TERLIZZI  UOH:[id number]  DOB: 06/02/1956   DOA: 11/27/2020  Referring Physician: Merton Border, MD  HPI: SALLY REIMERS is a 65 y.o. male seen for follow up of Acute on Chronic Respiratory Failure.  Off the ventilator and now patient is on T collar has been on 28% FiO2 the goal today is 48 hours  Medications: Reviewed on Rounds  Physical Exam:  Vitals: Temperature 97.6 pulse 97 respiratory 32 blood pressure is 149/76 saturations 100%  Ventilator Settings T collar FiO2 28%  . General: Comfortable at this time . Eyes: Grossly normal lids, irises & conjunctiva . ENT: grossly tongue is normal . Neck: no obvious mass . Cardiovascular: S1 S2 normal no gallop . Respiratory: No rhonchi no rales . Abdomen: soft . Skin: no rash seen on limited exam . Musculoskeletal: not rigid . Psychiatric:unable to assess . Neurologic: no seizure no involuntary movements         Lab Data:   Basic Metabolic Panel: Recent Labs  Lab 11/12/20 0454 11/14/20 0536 11/18/20 0629  NA 141 138 135  K 3.2* 3.6  3.5 3.2*  CL 106 103 99  CO2 22 21* 28  GLUCOSE 129* 113* 143*  BUN 83* 56* 46*  CREATININE 4.27* 3.68* 3.40*  CALCIUM 8.7* 8.4* 8.0*  PHOS  --  3.1 3.0    ABG: Recent Labs  Lab 11/17/20 2121  PHART 7.416  PCO2ART 42.8  PO2ART 68.8*  HCO3 27.0  O2SAT 94.4    Liver Function Tests: Recent Labs  Lab 11/14/20 0536 11/18/20 0629  ALBUMIN 2.0* 1.9*   No results for input(s): LIPASE, AMYLASE in the last 168 hours. No results for input(s): AMMONIA in the last 168 hours.  CBC: Recent Labs  Lab 11/12/20 0454 11/14/20 0536 11/18/20 0629  WBC 8.4 11.7* 9.1  HGB 7.3* 8.5* 7.3*  HCT 23.8* 27.7* 22.8*  MCV 97.1 99.6 99.1  PLT 185 199 213    Cardiac Enzymes: No results for input(s): CKTOTAL, CKMB, CKMBINDEX, TROPONINI in the  last 168 hours.  BNP (last 3 results) No results for input(s): BNP in the last 8760 hours.  ProBNP (last 3 results) No results for input(s): PROBNP in the last 8760 hours.  Radiological Exams: No results found.  Assessment/Plan Active Problems:   Acute on chronic respiratory failure with hypoxia (HCC)   Atrial fibrillation with rapid ventricular response (HCC)   End stage renal failure on dialysis [facility])   Cardiac arrest ([place])   Non-STEMI (non-ST elevated myocardial infarction) ([place])   1. Acute on chronic respiratory failure hypoxia we will continue with the wean on T collar completing 48 hours 2. End-stage renal failure being seen by nephrology 3. Atrial fibrillation rate controlled 4. Cardiac arrest rhythm has been stable 5. Non-STEMI no change we will continue to monitor closely   I have personally seen and evaluated the patient, evaluated laboratory and imaging results, formulated the assessment and plan and placed orders. The Patient requires high complexity decision making with multiple systems involvement.  Rounds were done with the Respiratory Therapy Director and Staff therapists and discussed with nursing staff also.  [name], MD [facility] Pulmonary Critical Care Medicine Sleep Medicine

## 2020-11-18 NOTE — Progress Notes (Signed)
[facility]  ROUNDING NOTE   Subjective:  Patient seen at bedside. Due for hemodialysis treatment today. Currently on T-piece. Having some secretions from tracheostomy.   Objective:  Vital signs in last 24 hours:  Temperature 97.3 pulse 94 respirations 20 blood pressure 139/53   Physical Exam: General:  Chronically ill-appearing  Head:  Normocephalic, atraumatic. Moist oral mucosal membranes  Eyes:  Anicteric  Neck:  Tracheostomy in place  Lungs:   Clear to auscultation, normal effort  Heart:  S1S2 irregular  Abdomen:   Soft, nontender, bowel sounds present  Extremities:  1+ peripheral edema.  Neurologic:  Awake, alert, following commands  Skin:  No acute rash  Access:  Left upper extremity AV fistula    Basic Metabolic Panel: Recent Labs  Lab 11/12/20 0454 11/14/20 0536 11/18/20 0629  NA 141 138 135  K 3.2* 3.6  3.5 3.2*  CL 106 103 99  CO2 22 21* 28  GLUCOSE 129* 113* 143*  BUN 83* 56* 46*  CREATININE 4.27* 3.68* 3.40*  CALCIUM 8.7* 8.4* 8.0*  PHOS  --  3.1 3.0    Liver Function Tests: Recent Labs  Lab 11/14/20 0536 11/18/20 0629  ALBUMIN 2.0* 1.9*   No results for input(s): LIPASE, AMYLASE in the last 168 hours. No results for input(s): AMMONIA in the last 168 hours.  CBC: Recent Labs  Lab 11/12/20 0454 11/14/20 0536 11/18/20 0629  WBC 8.4 11.7* 9.1  HGB 7.3* 8.5* 7.3*  HCT 23.8* 27.7* 22.8*  MCV 97.1 99.6 99.1  PLT 185 199 213    Cardiac Enzymes: No results for input(s): CKTOTAL, CKMB, CKMBINDEX, TROPONINI in the last 168 hours.  BNP: Invalid input(s): POCBNP  CBG: No results for input(s): GLUCAP in the last 168 hours.  Microbiology: Results for orders placed or performed during the hospital encounter of 11/18/2020  C Difficile Quick Screen (NO PCR Reflex)     Status: Abnormal   Collection Time: 11/09/20  9:59 AM   Specimen: STOOL  Result Value Ref Range Status   C Diff antigen POSITIVE (A) NEGATIVE Final   C Diff  toxin NEGATIVE NEGATIVE Final   C Diff interpretation   Final    Results are indeterminate. Please contact the provider listed for your campus for C diff questions in [place].    Comment: Performed at [facility], [place] [address] [place], [place] [address]    Coagulation Studies: No results for input(s): LABPROT, INR in the last 72 hours.  Urinalysis: No results for input(s): COLORURINE, LABSPEC, PHURINE, GLUCOSEU, HGBUR, BILIRUBINUR, KETONESUR, PROTEINUR, UROBILINOGEN, NITRITE, LEUKOCYTESUR in the last 72 hours.  Invalid input(s): APPERANCEUR    Imaging: No results found.   Medications:       Assessment/ Plan:  65 y.o. 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  1.  ESRD on HD MWF.    Patient due for hemodialysis treatment today.  Ultrafiltration target 2 kg.  2.  Acute respiratory failure.  Remains on T-piece at the moment.  Having some secretions.  3.  Anemia of chronic kidney disease.  Lab Results  Component Value Date   HGB 7.3 (L) 11/18/2020  Hemoglobin down a bit to 7.3.  With prior history of  GI bleed.  Consider blood transfusion but defer to primary team.  4.  Secondary hyperparathyroidism.  Lab Results  Component Value Date   CALCIUM 8.0 (L) 11/18/2020   CAION 1.17 11/14/2020   PHOS 3.0 11/18/2020  Phosphorus within target range.  Continue to monitor calcium, phosphorus periodically.       LOS: 0 Eric Spence 2/18/20227:59 AM

## 2020-11-19 DIAGNOSIS — I469 Cardiac arrest, cause unspecified: Secondary | ICD-10-CM | POA: Diagnosis not present

## 2020-11-19 DIAGNOSIS — J9621 Acute and chronic respiratory failure with hypoxia: Secondary | ICD-10-CM | POA: Diagnosis not present

## 2020-11-19 DIAGNOSIS — I4891 Unspecified atrial fibrillation: Secondary | ICD-10-CM | POA: Diagnosis not present

## 2020-11-19 DIAGNOSIS — N186 End stage renal disease: Secondary | ICD-10-CM | POA: Diagnosis not present

## 2020-11-19 LAB — RENAL FUNCTION PANEL
Albumin: 1.8 g/dL — ABNORMAL LOW (ref 3.5–5.0)
Anion gap: 10 (ref 5–15)
BUN: 62 mg/dL — ABNORMAL HIGH (ref 8–23)
CO2: 25 mmol/L (ref 22–32)
Calcium: 7.9 mg/dL — ABNORMAL LOW (ref 8.9–10.3)
Chloride: 100 mmol/L (ref 98–111)
Creatinine, Ser: 3.99 mg/dL — ABNORMAL HIGH (ref 0.61–1.24)
GFR, Estimated: 16 mL/min — ABNORMAL LOW (ref >60)
Glucose, Bld: 134 mg/dL — ABNORMAL HIGH (ref 70–99)
Phosphorus: 3.6 mg/dL (ref 2.5–4.6)
Potassium: 3.7 mmol/L (ref 3.5–5.1)
Sodium: 135 mmol/L (ref 135–145)

## 2020-11-19 LAB — CBC
HCT: 23.9 % — ABNORMAL LOW (ref 39.0–52.0)
Hemoglobin: 7.2 g/dL — ABNORMAL LOW (ref 13.0–17.0)
MCH: 30 pg (ref 26.0–34.0)
MCHC: 30.1 g/dL (ref 30.0–36.0)
MCV: 99.6 fL (ref 80.0–100.0)
Platelets: 201 10*3/uL (ref 150–400)
RBC: 2.4 MIL/uL — ABNORMAL LOW (ref 4.22–5.81)
RDW: 21.2 % — ABNORMAL HIGH (ref 11.5–15.5)
WBC: 6.6 10*3/uL (ref 4.0–10.5)
nRBC: 0 % (ref 0.0–0.2)

## 2020-11-19 NOTE — Progress Notes (Signed)
Pulmonary Critical Care Medicine [place]   PULMONARY CRITICAL CARE SERVICE  PROGRESS NOTE  Date of Service: 11/19/2020  Eric Spence  DDU:[id number]  DOB: [birth year]   DOA: 11/12/2020  Referring Physician: Merton Border, MD  HPI: Eric Spence is a 65 y.o. male seen for follow up of Acute on Chronic Respiratory Failure.  Patient currently is on T collar has been on 28% FiO2 secretions are fair to moderate  Medications: Reviewed on Rounds  Physical Exam:  Vitals: Temperature 96.6 pulse 97 respiratory rate 29 blood pressure is 146/54 saturations 92%  Ventilator Settings off the ventilator on T collar  . General: Comfortable at this time . Eyes: Grossly normal lids, irises & conjunctiva . ENT: grossly tongue is normal . Neck: no obvious mass . Cardiovascular: S1 S2 normal no gallop . Respiratory: Scattered rhonchi expansion is equal . Abdomen: soft . Skin: no rash seen on limited exam . Musculoskeletal: not rigid . Psychiatric:unable to assess . Neurologic: no seizure no involuntary movements         Lab Data:   Basic Metabolic Panel: Recent Labs  Lab 11/14/20 0536 11/18/20 0629  NA 138 135  K 3.6  3.5 3.2*  CL 103 99  CO2 21* 28  GLUCOSE 113* 143*  BUN 56* 46*  CREATININE 3.68* 3.40*  CALCIUM 8.4* 8.0*  PHOS 3.1 3.0    ABG: Recent Labs  Lab 11/17/20 2121  PHART 7.416  PCO2ART 42.8  PO2ART 68.8*  HCO3 27.0  O2SAT 94.4    Liver Function Tests: Recent Labs  Lab 11/14/20 0536 11/18/20 0629  ALBUMIN 2.0* 1.9*   No results for input(s): LIPASE, AMYLASE in the last 168 hours. No results for input(s): AMMONIA in the last 168 hours.  CBC: Recent Labs  Lab 11/14/20 0536 11/18/20 0629  WBC 11.7* 9.1  HGB 8.5* 7.3*  HCT 27.7* 22.8*  MCV 99.6 99.1  PLT 199 213    Cardiac Enzymes: No results for input(s): CKTOTAL, CKMB, CKMBINDEX, TROPONINI in the last 168 hours.  BNP (last 3 results) No results for input(s): BNP in the  last 8760 hours.  ProBNP (last 3 results) No results for input(s): PROBNP in the last 8760 hours.  Radiological Exams: No results found.  Assessment/Plan Active Problems:   Acute on chronic respiratory failure with hypoxia (HCC)   Atrial fibrillation with rapid ventricular response (HCC)   End stage renal failure on dialysis [facility])   Cardiac arrest ([place])   Non-STEMI (non-ST elevated myocardial infarction) ([place])   1. Acute on chronic respiratory failure hypoxia we will continue T collar titrate oxygen continue pulmonary toilet. 2. Atrial fibrillation rate is controlled at this time 3. End-stage renal failure on hemodialysis we will continue to follow 4. Cardiac arrest rhythm is stable 5. Non-STEMI no change   I have personally seen and evaluated the patient, evaluated laboratory and imaging results, formulated the assessment and plan and placed orders. The Patient requires high complexity decision making with multiple systems involvement.  Rounds were done with the Respiratory Therapy Director and Staff therapists and discussed with nursing staff also.  [name], MD [facility] Pulmonary Critical Care Medicine Sleep Medicine

## 2020-11-20 DIAGNOSIS — J9621 Acute and chronic respiratory failure with hypoxia: Secondary | ICD-10-CM | POA: Diagnosis not present

## 2020-11-20 DIAGNOSIS — I4891 Unspecified atrial fibrillation: Secondary | ICD-10-CM | POA: Diagnosis not present

## 2020-11-20 DIAGNOSIS — N186 End stage renal disease: Secondary | ICD-10-CM | POA: Diagnosis not present

## 2020-11-20 DIAGNOSIS — I469 Cardiac arrest, cause unspecified: Secondary | ICD-10-CM | POA: Diagnosis not present

## 2020-11-20 NOTE — Progress Notes (Signed)
Pulmonary Critical Care Medicine [place]   PULMONARY CRITICAL CARE SERVICE  PROGRESS NOTE  Date of Service: 11/20/2020  Eric Spence  HRC:[id number]  DOB: 1956/05/13   DOA: 11/03/2020  Referring Physician: Merton Border, MD  HPI: Eric Spence is a 65 y.o. male seen for follow up of Acute on Chronic Respiratory Failure.  Patient is T collar currently on 40% FiO2 good saturations  Medications: Reviewed on Rounds  Physical Exam:  Vitals: Temperature 97.8 pulse 92 respiratory 22 blood pressure is 102/54 saturations 97%  Ventilator Settings on T collar FiO2 of 40%  . General: Comfortable at this time . Eyes: Grossly normal lids, irises & conjunctiva . ENT: grossly tongue is normal . Neck: no obvious mass . Cardiovascular: S1 S2 normal no gallop . Respiratory: Scattered rhonchi expansion is equal . Abdomen: soft . Skin: no rash seen on limited exam . Musculoskeletal: not rigid . Psychiatric:unable to assess . Neurologic: no seizure no involuntary movements         Lab Data:   Basic Metabolic Panel: Recent Labs  Lab 11/14/20 0536 11/18/20 0629 11/19/20 1350  NA 138 135 135  K 3.6  3.5 3.2* 3.7  CL 103 99 100  CO2 21* 28 25  GLUCOSE 113* 143* 134*  BUN 56* 46* 62*  CREATININE 3.68* 3.40* 3.99*  CALCIUM 8.4* 8.0* 7.9*  PHOS 3.1 3.0 3.6    ABG: Recent Labs  Lab 11/17/20 2121  PHART 7.416  PCO2ART 42.8  PO2ART 68.8*  HCO3 27.0  O2SAT 94.4    Liver Function Tests: Recent Labs  Lab 11/14/20 0536 11/18/20 0629 11/19/20 1350  ALBUMIN 2.0* 1.9* 1.8*   No results for input(s): LIPASE, AMYLASE in the last 168 hours. No results for input(s): AMMONIA in the last 168 hours.  CBC: Recent Labs  Lab 11/14/20 0536 11/18/20 0629 11/19/20 1350  WBC 11.7* 9.1 6.6  HGB 8.5* 7.3* 7.2*  HCT 27.7* 22.8* 23.9*  MCV 99.6 99.1 99.6  PLT 199 213 201    Cardiac Enzymes: No results for input(s): CKTOTAL, CKMB, CKMBINDEX, TROPONINI in the  last 168 hours.  BNP (last 3 results) No results for input(s): BNP in the last 8760 hours.  ProBNP (last 3 results) No results for input(s): PROBNP in the last 8760 hours.  Radiological Exams: No results found.  Assessment/Plan Active Problems:   Acute on chronic respiratory failure with hypoxia (HCC)   Atrial fibrillation with rapid ventricular response (HCC)   End stage renal failure on dialysis [facility])   Cardiac arrest ([place])   Non-STEMI (non-ST elevated myocardial infarction) ([place])   1. Acute on chronic respiratory failure hypoxia we will continue with the T collar wean right now is on 40% FiO2 try to decrease oxygen 2. Atrial fibrillation rate is controlled 3. End-stage renal failure on hemodialysis we will continue to follow 4. Cardiac arrest rhythm stable 5. Non-STEMI no change we will continue to follow   I have personally seen and evaluated the patient, evaluated laboratory and imaging results, formulated the assessment and plan and placed orders. The Patient requires high complexity decision making with multiple systems involvement.  Rounds were done with the Respiratory Therapy Director and Staff therapists and discussed with nursing staff also.  [name], MD [facility] Pulmonary Critical Care Medicine Sleep Medicine

## 2020-11-21 DIAGNOSIS — J9621 Acute and chronic respiratory failure with hypoxia: Secondary | ICD-10-CM | POA: Diagnosis not present

## 2020-11-21 DIAGNOSIS — I4891 Unspecified atrial fibrillation: Secondary | ICD-10-CM | POA: Diagnosis not present

## 2020-11-21 DIAGNOSIS — N186 End stage renal disease: Secondary | ICD-10-CM | POA: Diagnosis not present

## 2020-11-21 DIAGNOSIS — I469 Cardiac arrest, cause unspecified: Secondary | ICD-10-CM | POA: Diagnosis not present

## 2020-11-21 LAB — RENAL FUNCTION PANEL
Albumin: 1.9 g/dL — ABNORMAL LOW (ref 3.5–5.0)
Anion gap: 8 (ref 5–15)
BUN: 58 mg/dL — ABNORMAL HIGH (ref 8–23)
CO2: 28 mmol/L (ref 22–32)
Calcium: 8.1 mg/dL — ABNORMAL LOW (ref 8.9–10.3)
Chloride: 99 mmol/L (ref 98–111)
Creatinine, Ser: 3.37 mg/dL — ABNORMAL HIGH (ref 0.61–1.24)
GFR, Estimated: 20 mL/min — ABNORMAL LOW (ref >60)
Glucose, Bld: 140 mg/dL — ABNORMAL HIGH (ref 70–99)
Phosphorus: 3 mg/dL (ref 2.5–4.6)
Potassium: 4.2 mmol/L (ref 3.5–5.1)
Sodium: 135 mmol/L (ref 135–145)

## 2020-11-21 LAB — CBC
HCT: 22.9 % — ABNORMAL LOW (ref 39.0–52.0)
Hemoglobin: 7 g/dL — ABNORMAL LOW (ref 13.0–17.0)
MCH: 31.3 pg (ref 26.0–34.0)
MCHC: 30.6 g/dL (ref 30.0–36.0)
MCV: 102.2 fL — ABNORMAL HIGH (ref 80.0–100.0)
Platelets: 217 10*3/uL (ref 150–400)
RBC: 2.24 MIL/uL — ABNORMAL LOW (ref 4.22–5.81)
RDW: 21.1 % — ABNORMAL HIGH (ref 11.5–15.5)
WBC: 7.6 10*3/uL (ref 4.0–10.5)
nRBC: 0.3 % — ABNORMAL HIGH (ref 0.0–0.2)

## 2020-11-21 NOTE — Progress Notes (Signed)
[facility]  ROUNDING NOTE   Subjective:  Patient due for hemodialysis treatment later today. Reports some swelling in lower extremities today.   Objective:  Vital signs in last 24 hours:  Temperature 97.8 pulse 92 respirations 22 blood pressure 102/54   Physical Exam: General:  Chronically ill-appearing  Head:  Normocephalic, atraumatic. Moist oral mucosal membranes  Eyes:  Anicteric  Neck:  Tracheostomy in place  Lungs:   Clear to auscultation, normal effort  Heart:  S1S2 irregular  Abdomen:   Soft, nontender, bowel sounds present  Extremities:  1+ peripheral edema.  Neurologic:  Awake, alert, following commands  Skin:  No acute rash  Access:  Left upper extremity AV fistula    Basic Metabolic Panel: Recent Labs  Lab 11/18/20 0629 11/19/20 1350  NA 135 135  K 3.2* 3.7  CL 99 100  CO2 28 25  GLUCOSE 143* 134*  BUN 46* 62*  CREATININE 3.40* 3.99*  CALCIUM 8.0* 7.9*  PHOS 3.0 3.6    Liver Function Tests: Recent Labs  Lab 11/18/20 0629 11/19/20 1350  ALBUMIN 1.9* 1.8*   No results for input(s): LIPASE, AMYLASE in the last 168 hours. No results for input(s): AMMONIA in the last 168 hours.  CBC: Recent Labs  Lab 11/18/20 0629 11/19/20 1350  WBC 9.1 6.6  HGB 7.3* 7.2*  HCT 22.8* 23.9*  MCV 99.1 99.6  PLT 213 201    Cardiac Enzymes: No results for input(s): CKTOTAL, CKMB, CKMBINDEX, TROPONINI in the last 168 hours.  BNP: Invalid input(s): POCBNP  CBG: No results for input(s): GLUCAP in the last 168 hours.  Microbiology: Results for orders placed or performed during the hospital encounter of 11/07/2020  C Difficile Quick Screen (NO PCR Reflex)     Status: Abnormal   Collection Time: 11/09/20  9:59 AM   Specimen: STOOL  Result Value Ref Range Status   C Diff antigen POSITIVE (A) NEGATIVE Final   C Diff toxin NEGATIVE NEGATIVE Final   C Diff interpretation   Final    Results are indeterminate. Please contact the provider listed for  your campus for C diff questions in [place].    Comment: Performed at [facility], [place] [address], [place], [place] [address]    Coagulation Studies: No results for input(s): LABPROT, INR in the last 72 hours.  Urinalysis: No results for input(s): COLORURINE, LABSPEC, PHURINE, GLUCOSEU, HGBUR, BILIRUBINUR, KETONESUR, PROTEINUR, UROBILINOGEN, NITRITE, LEUKOCYTESUR in the last 72 hours.  Invalid input(s): APPERANCEUR    Imaging: No results found.   Medications:       Assessment/ Plan:  65 y.o. 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  1.  ESRD on HD MWF.    We will maintain the patient on MWF dialysis schedule.  Due for hemodialysis treatment today.  2.  Acute respiratory failure.  Appears to be breathing comfortably.  Tracheostomy in place.  3.  Anemia of chronic kidney disease.  Lab Results  Component Value Date   HGB 7.2 (L) 11/19/2020  Hemoglobin appears to have stabilized at 7.2.  No immediate need for transfusion but if hemoglobin becomes 7 or less consider blood transfusion.  4.  Secondary hyperparathyroidism.  Lab Results  Component  Value Date   CALCIUM 7.9 (L) 11/19/2020   CAION 1.17 11/12/2020   PHOS 3.6 11/19/2020  Phosphorus acceptable at 3.6.     LOS: 0 Jasemine Nawaz 2/21/20228:03 AM

## 2020-11-21 NOTE — Progress Notes (Signed)
Pulmonary Critical Care Medicine [place]   PULMONARY CRITICAL CARE SERVICE  PROGRESS NOTE  Date of Service: 11/21/2020  Eric Spence  JSH:[id number]  DOB: 01-Dec-1955   DOA: 11/28/2020  Referring Physician: Merton Border, MD  HPI: Eric Spence is a 65 y.o. male seen for follow up of Acute on Chronic Respiratory Failure.  Patient at this time is on T collar has been on 35% FiO2 with good saturations.  Medications: Reviewed on Rounds  Physical Exam:  Vitals: Temperature is 98.6 pulse 60 respiratory rate is 26 blood pressure is 100/48 saturations 98%  Ventilator Settings on T collar with an FiO2 of 35%  . General: Comfortable at this time . Eyes: Grossly normal lids, irises & conjunctiva . ENT: grossly tongue is normal . Neck: no obvious mass . Cardiovascular: S1 S2 normal no gallop . Respiratory: Scattered rhonchi expansion is equal . Abdomen: soft . Skin: no rash seen on limited exam . Musculoskeletal: not rigid . Psychiatric:unable to assess . Neurologic: no seizure no involuntary movements         Lab Data:   Basic Metabolic Panel: Recent Labs  Lab 11/18/20 0629 11/19/20 1350 11/21/20 1131  NA 135 135 135  K 3.2* 3.7 4.2  CL 99 100 99  CO2 28 25 28   GLUCOSE 143* 134* 140*  BUN 46* 62* 58*  CREATININE 3.40* 3.99* 3.37*  CALCIUM 8.0* 7.9* 8.1*  PHOS 3.0 3.6 3.0    ABG: Recent Labs  Lab 11/17/20 2121  PHART 7.416  PCO2ART 42.8  PO2ART 68.8*  HCO3 27.0  O2SAT 94.4    Liver Function Tests: Recent Labs  Lab 11/18/20 0629 11/19/20 1350 11/21/20 1131  ALBUMIN 1.9* 1.8* 1.9*   No results for input(s): LIPASE, AMYLASE in the last 168 hours. No results for input(s): AMMONIA in the last 168 hours.  CBC: Recent Labs  Lab 11/18/20 0629 11/19/20 1350 11/21/20 1131  WBC 9.1 6.6 7.6  HGB 7.3* 7.2* 7.0*  HCT 22.8* 23.9* 22.9*  MCV 99.1 99.6 102.2*  PLT 213 201 217    Cardiac Enzymes: No results for input(s): CKTOTAL,  CKMB, CKMBINDEX, TROPONINI in the last 168 hours.  BNP (last 3 results) No results for input(s): BNP in the last 8760 hours.  ProBNP (last 3 results) No results for input(s): PROBNP in the last 8760 hours.  Radiological Exams: No results found.  Assessment/Plan Active Problems:   Acute on chronic respiratory failure with hypoxia (HCC)   Atrial fibrillation with rapid ventricular response (HCC)   End stage renal failure on dialysis [facility])   Cardiac arrest ([place])   Non-STEMI (non-ST elevated myocardial infarction) ([place])   1. Acute on chronic respiratory failure with hypoxia plan is to continue with T collar wean on 35% FiO2.  Secretions are moderate.  We will continue with pulmonary toilet 2. Atrial fibrillation rate is controlled we will continue to follow-up 3. End-stage renal failure on hemodialysis 4. Cardiac arrest rhythm has been stable 5. Non-STEMI no change we will continue to follow   I have personally seen and evaluated the patient, evaluated laboratory and imaging results, formulated the assessment and plan and placed orders. The Patient requires high complexity decision making with multiple systems involvement.  Rounds were done with the Respiratory Therapy Director and Staff therapists and discussed with nursing staff also.  [name], MD [facility] Pulmonary Critical Care Medicine Sleep Medicine

## 2020-11-22 DIAGNOSIS — I469 Cardiac arrest, cause unspecified: Secondary | ICD-10-CM | POA: Diagnosis not present

## 2020-11-22 DIAGNOSIS — I4891 Unspecified atrial fibrillation: Secondary | ICD-10-CM | POA: Diagnosis not present

## 2020-11-22 DIAGNOSIS — J9621 Acute and chronic respiratory failure with hypoxia: Secondary | ICD-10-CM | POA: Diagnosis not present

## 2020-11-22 DIAGNOSIS — N186 End stage renal disease: Secondary | ICD-10-CM | POA: Diagnosis not present

## 2020-11-22 LAB — CBC
HCT: 26.1 % — ABNORMAL LOW (ref 39.0–52.0)
Hemoglobin: 7.5 g/dL — ABNORMAL LOW (ref 13.0–17.0)
MCH: 29.8 pg (ref 26.0–34.0)
MCHC: 28.7 g/dL — ABNORMAL LOW (ref 30.0–36.0)
MCV: 103.6 fL — ABNORMAL HIGH (ref 80.0–100.0)
Platelets: 250 10*3/uL (ref 150–400)
RBC: 2.52 MIL/uL — ABNORMAL LOW (ref 4.22–5.81)
RDW: 21.2 % — ABNORMAL HIGH (ref 11.5–15.5)
WBC: 7.8 10*3/uL (ref 4.0–10.5)
nRBC: 0 % (ref 0.0–0.2)

## 2020-11-22 LAB — OCCULT BLOOD X 1 CARD TO LAB, STOOL: Fecal Occult Bld: POSITIVE — AB

## 2020-11-22 NOTE — Progress Notes (Signed)
Pulmonary Critical Care Medicine [place]   PULMONARY CRITICAL CARE SERVICE  PROGRESS NOTE  Date of Service: 11/22/2020  Eric Spence  JSH:[id number]  DOB: [birth year]   DOA: 11/11/2020  Referring Physician: Merton Border, MD  HPI: Eric Spence is a 65 y.o. male seen for follow up of Acute on Chronic Respiratory Failure.  Patient currently is on T collar has been on 35% FiO2 with good saturations.  Medications: Reviewed on Rounds  Physical Exam:  Vitals: Temperature is 97.6 pulse 82 respiratory rate 40 blood pressure is 130/69 saturations 100%  Ventilator Settings on T collar with an FiO2 of 35%  . General: Comfortable at this time . Eyes: Grossly normal lids, irises & conjunctiva . ENT: grossly tongue is normal . Neck: no obvious mass . Cardiovascular: S1 S2 normal no gallop . Respiratory: Scattered rhonchi expansion is equal . Abdomen: soft . Skin: no rash seen on limited exam . Musculoskeletal: not rigid . Psychiatric:unable to assess . Neurologic: no seizure no involuntary movements         Lab Data:   Basic Metabolic Panel: Recent Labs  Lab 11/18/20 0629 11/19/20 1350 11/21/20 1131  NA 135 135 135  K 3.2* 3.7 4.2  CL 99 100 99  CO2 28 25 28   GLUCOSE 143* 134* 140*  BUN 46* 62* 58*  CREATININE 3.40* 3.99* 3.37*  CALCIUM 8.0* 7.9* 8.1*  PHOS 3.0 3.6 3.0    ABG: Recent Labs  Lab 11/17/20 2121  PHART 7.416  PCO2ART 42.8  PO2ART 68.8*  HCO3 27.0  O2SAT 94.4    Liver Function Tests: Recent Labs  Lab 11/18/20 0629 11/19/20 1350 11/21/20 1131  ALBUMIN 1.9* 1.8* 1.9*   No results for input(s): LIPASE, AMYLASE in the last 168 hours. No results for input(s): AMMONIA in the last 168 hours.  CBC: Recent Labs  Lab 11/18/20 0629 11/19/20 1350 11/21/20 1131  WBC 9.1 6.6 7.6  HGB 7.3* 7.2* 7.0*  HCT 22.8* 23.9* 22.9*  MCV 99.1 99.6 102.2*  PLT 213 201 217    Cardiac Enzymes: No results for input(s): CKTOTAL, CKMB,  CKMBINDEX, TROPONINI in the last 168 hours.  BNP (last 3 results) No results for input(s): BNP in the last 8760 hours.  ProBNP (last 3 results) No results for input(s): PROBNP in the last 8760 hours.  Radiological Exams: No results found.  Assessment/Plan Active Problems:   Acute on chronic respiratory failure with hypoxia (HCC)   Atrial fibrillation with rapid ventricular response (HCC)   End stage renal failure on dialysis [facility])   Cardiac arrest ([place])   Non-STEMI (non-ST elevated myocardial infarction) ([place])   1. Acute on chronic respiratory failure hypoxia we will continue with T collar on 35% FiO2.  Continue secretion management pulmonary toilet. 2. Atrial fibrillation rate is controlled 3. End-stage renal failure on hemodialysis 4. Cardiac arrest rhythm has been stable 5. Non-STEMI no change continue supportive care   I have personally seen and evaluated the patient, evaluated laboratory and imaging results, formulated the assessment and plan and placed orders. The Patient requires high complexity decision making with multiple systems involvement.  Rounds were done with the Respiratory Therapy Director and Staff therapists and discussed with nursing staff also.  [name], MD [facility] Pulmonary Critical Care Medicine Sleep Medicine

## 2020-11-23 ENCOUNTER — Other Ambulatory Visit (HOSPITAL_COMMUNITY): Payer: Medicare Other

## 2020-11-23 DIAGNOSIS — N186 End stage renal disease: Secondary | ICD-10-CM | POA: Diagnosis not present

## 2020-11-23 DIAGNOSIS — J9621 Acute and chronic respiratory failure with hypoxia: Secondary | ICD-10-CM | POA: Diagnosis not present

## 2020-11-23 DIAGNOSIS — I4891 Unspecified atrial fibrillation: Secondary | ICD-10-CM | POA: Diagnosis not present

## 2020-11-23 DIAGNOSIS — I469 Cardiac arrest, cause unspecified: Secondary | ICD-10-CM | POA: Diagnosis not present

## 2020-11-23 LAB — RENAL FUNCTION PANEL
Albumin: 2.1 g/dL — ABNORMAL LOW (ref 3.5–5.0)
Anion gap: 12 (ref 5–15)
BUN: 58 mg/dL — ABNORMAL HIGH (ref 8–23)
CO2: 26 mmol/L (ref 22–32)
Calcium: 8.6 mg/dL — ABNORMAL LOW (ref 8.9–10.3)
Chloride: 97 mmol/L — ABNORMAL LOW (ref 98–111)
Creatinine, Ser: 3.27 mg/dL — ABNORMAL HIGH (ref 0.61–1.24)
GFR, Estimated: 20 mL/min — ABNORMAL LOW (ref >60)
Glucose, Bld: 136 mg/dL — ABNORMAL HIGH (ref 70–99)
Phosphorus: 2.8 mg/dL (ref 2.5–4.6)
Potassium: 4 mmol/L (ref 3.5–5.1)
Sodium: 135 mmol/L (ref 135–145)

## 2020-11-23 LAB — BLOOD GAS, ARTERIAL
Acid-Base Excess: 5 mmol/L — ABNORMAL HIGH (ref 0.0–2.0)
Acid-Base Excess: 3.9 mmol/L — ABNORMAL HIGH (ref 0.0–2.0)
Acid-Base Excess: 5.2 mmol/L — ABNORMAL HIGH (ref 0.0–2.0)
Acid-Base Excess: 4.5 mmol/L — ABNORMAL HIGH (ref 0.0–2.0)
Bicarbonate: 30.2 mmol/L — ABNORMAL HIGH (ref 20.0–28.0)
Bicarbonate: 30.3 mmol/L — ABNORMAL HIGH (ref 20.0–28.0)
Bicarbonate: 31.3 mmol/L — ABNORMAL HIGH (ref 20.0–28.0)
Bicarbonate: 30.4 mmol/L — ABNORMAL HIGH (ref 20.0–28.0)
FIO2: 60
FIO2: 60
FIO2: 60
FIO2: 30
O2 Saturation: 91.4 %
O2 Saturation: 60.6 %
O2 Saturation: 96.3 %
O2 Saturation: >100 %
Patient temperature: 36.1
Patient temperature: 36.1
Patient temperature: 36.2
Patient temperature: 36.1
pCO2 arterial: 52.1 mmHg — ABNORMAL HIGH (ref 32.0–48.0)
pCO2 arterial: 63.6 mmHg — ABNORMAL HIGH (ref 32.0–48.0)
pCO2 arterial: 66.1 mmHg (ref 32.0–48.0)
pCO2 arterial: 60.6 mmHg — ABNORMAL HIGH (ref 32.0–48.0)
pH, Arterial: 7.375 (ref 7.350–7.450)
pH, Arterial: 7.278 — ABNORMAL LOW (ref 7.350–7.450)
pH, Arterial: 7.308 — ABNORMAL LOW (ref 7.350–7.450)
pH, Arterial: 7.316 — ABNORMAL LOW (ref 7.350–7.450)
pO2, Arterial: 57.3 mmHg — ABNORMAL LOW (ref 83.0–108.0)
pO2, Arterial: 32.7 mmHg — CL (ref 83.0–108.0)
pO2, Arterial: 82.3 mmHg — ABNORMAL LOW (ref 83.0–108.0)
pO2, Arterial: 292 mmHg — ABNORMAL HIGH (ref 83.0–108.0)

## 2020-11-23 LAB — CBC
HCT: 25.4 % — ABNORMAL LOW (ref 39.0–52.0)
Hemoglobin: 7.4 g/dL — ABNORMAL LOW (ref 13.0–17.0)
MCH: 30.1 pg (ref 26.0–34.0)
MCHC: 29.1 g/dL — ABNORMAL LOW (ref 30.0–36.0)
MCV: 103.3 fL — ABNORMAL HIGH (ref 80.0–100.0)
Platelets: 236 K/uL (ref 150–400)
RBC: 2.46 MIL/uL — ABNORMAL LOW (ref 4.22–5.81)
RDW: 21.1 % — ABNORMAL HIGH (ref 11.5–15.5)
WBC: 8.5 K/uL (ref 4.0–10.5)
nRBC: 0 % (ref 0.0–0.2)

## 2020-11-23 NOTE — Progress Notes (Signed)
[facility]  ROUNDING NOTE   Subjective:  Patient did complete hemodialysis treatment today. He cut the treatment short by 50 minutes. Currently on aerosolized trach collar.   Objective:  Vital signs in last 24 hours:  Temperature 97 pulse 109 respirations 26 blood pressure 135/61   Physical Exam: General:  Chronically ill-appearing  Head:  Normocephalic, atraumatic. Moist oral mucosal membranes  Eyes:  Anicteric  Neck:  Tracheostomy in place  Lungs:   Clear to auscultation, normal effort  Heart:  S1S2 irregular  Abdomen:   Soft, nontender, bowel sounds present  Extremities:  1+ peripheral edema.  Neurologic:  Awake, alert, following commands  Skin:  No acute rash  Access:  Left upper extremity AV fistula    Basic Metabolic Panel: Recent Labs  Lab 11/18/20 0629 11/19/20 1350 11/21/20 1131 11/23/20 0749  NA 135 135 135 135  K 3.2* 3.7 4.2 4.0  CL 99 100 99 97*  CO2 28 25 28 26   GLUCOSE 143* 134* 140* 136*  BUN 46* 62* 58* 58*  CREATININE 3.40* 3.99* 3.37* 3.27*  CALCIUM 8.0* 7.9* 8.1* 8.6*  PHOS 3.0 3.6 3.0 2.8    Liver Function Tests: Recent Labs  Lab 11/18/20 0629 11/19/20 1350 11/21/20 1131 11/23/20 0749  ALBUMIN 1.9* 1.8* 1.9* 2.1*   No results for input(s): LIPASE, AMYLASE in the last 168 hours. No results for input(s): AMMONIA in the last 168 hours.  CBC: Recent Labs  Lab 11/18/20 0629 11/19/20 1350 11/21/20 1131 11/22/20 1236 11/23/20 0454  WBC 9.1 6.6 7.6 7.8 8.5  HGB 7.3* 7.2* 7.0* 7.5* 7.4*  HCT 22.8* 23.9* 22.9* 26.1* 25.4*  MCV 99.1 99.6 102.2* 103.6* 103.3*  PLT 213 201 217 250 236    Cardiac Enzymes: No results for input(s): CKTOTAL, CKMB, CKMBINDEX, TROPONINI in the last 168 hours.  BNP: Invalid input(s): POCBNP  CBG: No results for input(s): GLUCAP in the last 168 hours.  Microbiology: Results for orders placed or performed during the hospital encounter of 11/13/2020  C Difficile Quick Screen (NO PCR  Reflex)     Status: Abnormal   Collection Time: 11/09/20  9:59 AM   Specimen: STOOL  Result Value Ref Range Status   C Diff antigen POSITIVE (A) NEGATIVE Final   C Diff toxin NEGATIVE NEGATIVE Final   C Diff interpretation   Final    Results are indeterminate. Please contact the provider listed for your campus for C diff questions in [place].    Comment: Performed at [facility], [place] [address], [place], [place] [address]    Coagulation Studies: No results for input(s): LABPROT, INR in the last 72 hours.  Urinalysis: No results for input(s): COLORURINE, LABSPEC, PHURINE, GLUCOSEU, HGBUR, BILIRUBINUR, KETONESUR, PROTEINUR, UROBILINOGEN, NITRITE, LEUKOCYTESUR in the last 72 hours.  Invalid input(s): APPERANCEUR    Imaging: DG CHEST PORT 1 VIEW  Result Date: 11/23/2020 CLINICAL DATA:  Pulmonary edema. EXAM: PORTABLE CHEST 1 VIEW COMPARISON:  11/19/2020 FINDINGS: Tracheostomy tube tip is above the carina. There is a left IJ catheter with tip in the projection of the SVC. Cardiac enlargement is stable. Interval increase in volume of right pleural effusion. Mild diffuse interstitial edema is noted. Focal area of increased opacification within the right midlung and right base is similar to previous exam. This may reflect posterior layering pleural effusion and/or overlying airspace consolidation/atelectasis. Extensive sclerotic bone metastases are again identified. IMPRESSION: 1. Cardiac enlargement, interstitial edema and right pleural effusion suggest CHF. Right pleural effusion has increased  in volume from previous exam. 2. Bilateral pulmonary opacities are identified, similar to previous exam which may represent areas of multifocal pneumonia. 3. Extensive sclerotic bone metastases. Electronically Signed   By: Kerby Moors M.D.   On: 11/23/2020 15:16     Medications:       Assessment/ Plan:  65 y.o. 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  1.  ESRD on HD MWF.    Patient underwent dialysis treatment today.  Continue to periodically for dialysis treatment short.  Continue dialysis on MWF schedule.  2.  Acute respiratory failure.  Continues to breathe comfortably through aerosolized trach collar.  3.  Anemia of chronic kidney disease.  Lab Results  Component Value Date   HGB 7.4 (L) 11/23/2020  Hemoglobin 7.4 as above.  Maintain the patient on Retacrit.  4.  Secondary hyperparathyroidism.  Lab Results  Component Value Date   CALCIUM 8.6 (L) 11/23/2020   CAION 1.17 11/19/2020   PHOS 2.8 11/23/2020  Phosphorus remains at target at 2.8.  Continue periodically monitor bone mineral metabolism parameters.   LOS: 0 Eric Spence 2/23/20223:50 PM

## 2020-11-23 NOTE — Progress Notes (Signed)
Pulmonary Critical Care Medicine [place]   PULMONARY CRITICAL CARE SERVICE  PROGRESS NOTE  Date of Service: 11/23/2020  Eric Spence  TDS:[id number]  DOB: 08/24/1956   DOA: 11/05/2020  Referring Physician: Merton Border, MD  HPI: Eric Spence is a 65 y.o. male seen for follow up of Acute on Chronic Respiratory Failure.  Patient is on T collar currently on 35% FiO2 secretions have been somewhat improved  Medications: Reviewed on Rounds  Physical Exam:  Vitals: Temperature is 97.0 pulse 109 respiratory rate 26 blood pressure is 135/61 saturations 92%  Ventilator Settings on T collar with an FiO2 of 35%  . General: Comfortable at this time . Eyes: Grossly normal lids, irises & conjunctiva . ENT: grossly tongue is normal . Neck: no obvious mass . Cardiovascular: S1 S2 normal no gallop . Respiratory: Coarse breath sounds with few scattered rhonchi . Abdomen: soft . Skin: no rash seen on limited exam . Musculoskeletal: not rigid . Psychiatric:unable to assess . Neurologic: no seizure no involuntary movements         Lab Data:   Basic Metabolic Panel: Recent Labs  Lab 11/18/20 0629 11/19/20 1350 11/21/20 1131 11/23/20 0749  NA 135 135 135 135  K 3.2* 3.7 4.2 4.0  CL 99 100 99 97*  CO2 28 25 28 26   GLUCOSE 143* 134* 140* 136*  BUN 46* 62* 58* 58*  CREATININE 3.40* 3.99* 3.37* 3.27*  CALCIUM 8.0* 7.9* 8.1* 8.6*  PHOS 3.0 3.6 3.0 2.8    ABG: Recent Labs  Lab 11/17/20 2121  PHART 7.416  PCO2ART 42.8  PO2ART 68.8*  HCO3 27.0  O2SAT 94.4    Liver Function Tests: Recent Labs  Lab 11/18/20 0629 11/19/20 1350 11/21/20 1131 11/23/20 0749  ALBUMIN 1.9* 1.8* 1.9* 2.1*   No results for input(s): LIPASE, AMYLASE in the last 168 hours. No results for input(s): AMMONIA in the last 168 hours.  CBC: Recent Labs  Lab 11/18/20 0629 11/19/20 1350 11/21/20 1131 11/22/20 1236 11/23/20 0454  WBC 9.1 6.6 7.6 7.8 8.5  HGB 7.3* 7.2* 7.0*  7.5* 7.4*  HCT 22.8* 23.9* 22.9* 26.1* 25.4*  MCV 99.1 99.6 102.2* 103.6* 103.3*  PLT 213 201 217 250 236    Cardiac Enzymes: No results for input(s): CKTOTAL, CKMB, CKMBINDEX, TROPONINI in the last 168 hours.  BNP (last 3 results) No results for input(s): BNP in the last 8760 hours.  ProBNP (last 3 results) No results for input(s): PROBNP in the last 8760 hours.  Radiological Exams: No results found.  Assessment/Plan Active Problems:   Acute on chronic respiratory failure with hypoxia (HCC)   Atrial fibrillation with rapid ventricular response (HCC)   End stage renal failure on dialysis [facility])   Cardiac arrest ([place])   Non-STEMI (non-ST elevated myocardial infarction) ([place])   1. Acute on chronic respiratory failure hypoxia we will try to start capping trials 2. Atrial fibrillation rate is controlled 3. End-stage renal failure on dialysis 4. Cardiac arrest rhythm stable 5. Non-STEMI no change   I have personally seen and evaluated the patient, evaluated laboratory and imaging results, formulated the assessment and plan and placed orders. The Patient requires high complexity decision making with multiple systems involvement.  Rounds were done with the Respiratory Therapy Director and Staff therapists and discussed with nursing staff also.  [name], MD [facility] Pulmonary Critical Care Medicine Sleep Medicine

## 2020-11-24 DIAGNOSIS — J9621 Acute and chronic respiratory failure with hypoxia: Secondary | ICD-10-CM | POA: Diagnosis not present

## 2020-11-24 DIAGNOSIS — N186 End stage renal disease: Secondary | ICD-10-CM | POA: Diagnosis not present

## 2020-11-24 DIAGNOSIS — I4891 Unspecified atrial fibrillation: Secondary | ICD-10-CM | POA: Diagnosis not present

## 2020-11-24 DIAGNOSIS — I469 Cardiac arrest, cause unspecified: Secondary | ICD-10-CM | POA: Diagnosis not present

## 2020-11-24 LAB — BLOOD GAS, ARTERIAL
Acid-Base Excess: 6 mmol/L — ABNORMAL HIGH (ref 0.0–2.0)
Bicarbonate: 30.9 mmol/L — ABNORMAL HIGH (ref 20.0–28.0)
FIO2: 30
O2 Saturation: 96.9 %
Patient temperature: 36.1
pCO2 arterial: 50.6 mmHg — ABNORMAL HIGH (ref 32.0–48.0)
pH, Arterial: 7.398 (ref 7.350–7.450)
pO2, Arterial: 72 mmHg — ABNORMAL LOW (ref 83.0–108.0)

## 2020-11-24 NOTE — Progress Notes (Signed)
Pulmonary Critical Care Medicine [place]   PULMONARY CRITICAL CARE SERVICE  PROGRESS NOTE  Date of Service: 11/24/2020  Eric Spence  WEX:[id number]  DOB: [birth year]   DOA: 11/23/2020  Referring Physician: Merton Border, MD  HPI: Eric Spence is a 65 y.o. male seen for follow up of Acute on Chronic Respiratory Failure.  Patient is on the ventilator currently on assist control mode had to go back on overnight  Medications: Reviewed on Rounds  Physical Exam:  Vitals: Temperature is 96.9 pulse 108 respiratory rate 32 blood pressure is 105/92 saturations 93%  Ventilator Settings on assist control FiO2 is 30% tidal volume 500 PEEP 5  . General: Comfortable at this time . Eyes: Grossly normal lids, irises & conjunctiva . ENT: grossly tongue is normal . Neck: no obvious mass . Cardiovascular: S1 S2 normal no gallop . Respiratory: Coarse rhonchi expansion is equal at this time . Abdomen: soft . Skin: no rash seen on limited exam . Musculoskeletal: not rigid . Psychiatric:unable to assess . Neurologic: no seizure no involuntary movements         Lab Data:   Basic Metabolic Panel: Recent Labs  Lab 11/18/20 0629 11/19/20 1350 11/21/20 1131 11/23/20 0749  NA 135 135 135 135  K 3.2* 3.7 4.2 4.0  CL 99 100 99 97*  CO2 28 25 28 26   GLUCOSE 143* 134* 140* 136*  BUN 46* 62* 58* 58*  CREATININE 3.40* 3.99* 3.37* 3.27*  CALCIUM 8.0* 7.9* 8.1* 8.6*  PHOS 3.0 3.6 3.0 2.8    ABG: Recent Labs  Lab 11/23/20 1348 11/23/20 1847 11/23/20 1910 11/23/20 2206 11/24/20 0457  PHART 7.375 7.308* 7.278* 7.316* 7.398  PCO2ART 52.1* 63.6* 66.1* 60.6* 50.6*  PO2ART 57.3* 32.7* 82.3* 292* 72.0*  HCO3 30.2* 31.3* 30.3* 30.4* 30.9*  O2SAT 91.4 60.6 96.3 >100.0 96.9    Liver Function Tests: Recent Labs  Lab 11/18/20 0629 11/19/20 1350 11/21/20 1131 11/23/20 0749  ALBUMIN 1.9* 1.8* 1.9* 2.1*   No results for input(s): LIPASE, AMYLASE in the last 168  hours. No results for input(s): AMMONIA in the last 168 hours.  CBC: Recent Labs  Lab 11/18/20 0629 11/19/20 1350 11/21/20 1131 11/22/20 1236 11/23/20 0454  WBC 9.1 6.6 7.6 7.8 8.5  HGB 7.3* 7.2* 7.0* 7.5* 7.4*  HCT 22.8* 23.9* 22.9* 26.1* 25.4*  MCV 99.1 99.6 102.2* 103.6* 103.3*  PLT 213 201 217 250 236    Cardiac Enzymes: No results for input(s): CKTOTAL, CKMB, CKMBINDEX, TROPONINI in the last 168 hours.  BNP (last 3 results) No results for input(s): BNP in the last 8760 hours.  ProBNP (last 3 results) No results for input(s): PROBNP in the last 8760 hours.  Radiological Exams: DG CHEST PORT 1 VIEW  Result Date: 11/23/2020 CLINICAL DATA:  Pulmonary edema. EXAM: PORTABLE CHEST 1 VIEW COMPARISON:  11/09/2020 FINDINGS: Tracheostomy tube tip is above the carina. There is a left IJ catheter with tip in the projection of the SVC. Cardiac enlargement is stable. Interval increase in volume of right pleural effusion. Mild diffuse interstitial edema is noted. Focal area of increased opacification within the right midlung and right base is similar to previous exam. This may reflect posterior layering pleural effusion and/or overlying airspace consolidation/atelectasis. Extensive sclerotic bone metastases are again identified. IMPRESSION: 1. Cardiac enlargement, interstitial edema and right pleural effusion suggest CHF. Right pleural effusion has increased in volume from previous exam. 2. Bilateral pulmonary opacities are identified, similar to previous exam which may  represent areas of multifocal pneumonia. 3. Extensive sclerotic bone metastases. Electronically Signed   By: Kerby Moors M.D.   On: 11/23/2020 15:16    Assessment/Plan Active Problems:   Acute on chronic respiratory failure with hypoxia (HCC)   Atrial fibrillation with rapid ventricular response (HCC)   End stage renal failure on dialysis [facility])   Cardiac arrest ([place])   Non-STEMI (non-ST elevated myocardial infarction)  ([place])   1. Acute on chronic respiratory failure with hypoxia patient at this time is back on the ventilator now on 30% FiO2 on assist control.  Respiratory therapy will continue to monitor. 2. Atrial fibrillation rate controlled we will continue to follow 3. End-stage renal failure on hemodialysis 4. Cardiac arrest stable rhythm 5. Non-STEMI no change we will continue to monitor   I have personally seen and evaluated the patient, evaluated laboratory and imaging results, formulated the assessment and plan and placed orders. The Patient requires high complexity decision making with multiple systems involvement.  Rounds were done with the Respiratory Therapy Director and Staff therapists and discussed with nursing staff also.  [name], MD [facility] Pulmonary Critical Care Medicine Sleep Medicine

## 2020-11-25 ENCOUNTER — Other Ambulatory Visit (HOSPITAL_COMMUNITY): Payer: Medicare Other

## 2020-11-25 DIAGNOSIS — I469 Cardiac arrest, cause unspecified: Secondary | ICD-10-CM | POA: Diagnosis not present

## 2020-11-25 DIAGNOSIS — N186 End stage renal disease: Secondary | ICD-10-CM | POA: Diagnosis not present

## 2020-11-25 DIAGNOSIS — I4891 Unspecified atrial fibrillation: Secondary | ICD-10-CM | POA: Diagnosis not present

## 2020-11-25 DIAGNOSIS — J9621 Acute and chronic respiratory failure with hypoxia: Secondary | ICD-10-CM | POA: Diagnosis not present

## 2020-11-25 LAB — CBC
HCT: 21.7 % — ABNORMAL LOW (ref 39.0–52.0)
Hemoglobin: 6.7 g/dL — CL (ref 13.0–17.0)
MCH: 31.2 pg (ref 26.0–34.0)
MCHC: 30.9 g/dL (ref 30.0–36.0)
MCV: 100.9 fL — ABNORMAL HIGH (ref 80.0–100.0)
Platelets: 210 10*3/uL (ref 150–400)
RBC: 2.15 MIL/uL — ABNORMAL LOW (ref 4.22–5.81)
RDW: 21.2 % — ABNORMAL HIGH (ref 11.5–15.5)
WBC: 7.6 10*3/uL (ref 4.0–10.5)
nRBC: 0 % (ref 0.0–0.2)

## 2020-11-25 LAB — RENAL FUNCTION PANEL
Albumin: 2 g/dL — ABNORMAL LOW (ref 3.5–5.0)
Anion gap: 12 (ref 5–15)
BUN: 65 mg/dL — ABNORMAL HIGH (ref 8–23)
CO2: 27 mmol/L (ref 22–32)
Calcium: 8.4 mg/dL — ABNORMAL LOW (ref 8.9–10.3)
Chloride: 96 mmol/L — ABNORMAL LOW (ref 98–111)
Creatinine, Ser: 3.27 mg/dL — ABNORMAL HIGH (ref 0.61–1.24)
GFR, Estimated: 20 mL/min — ABNORMAL LOW (ref >60)
Glucose, Bld: 136 mg/dL — ABNORMAL HIGH (ref 70–99)
Phosphorus: 2.4 mg/dL — ABNORMAL LOW (ref 2.5–4.6)
Potassium: 4 mmol/L (ref 3.5–5.1)
Sodium: 135 mmol/L (ref 135–145)

## 2020-11-25 LAB — PREPARE RBC (CROSSMATCH)

## 2020-11-25 NOTE — Progress Notes (Signed)
[facility]  ROUNDING NOTE   Subjective:  Patient seen and evaluated at bedside. Due for hemodialysis later today. Currently back on the ventilator.   Objective:  Vital signs in last 24 hours:  Temperature 96.9 pulse 61 respirations 21 blood pressure 96/43   Physical Exam: General:  Chronically ill-appearing  Head:  Normocephalic, atraumatic. Moist oral mucosal membranes  Eyes:  Anicteric  Neck:  Tracheostomy in place  Lungs:   Clear to auscultation, vent assisted  Heart:  S1S2 irregular  Abdomen:   Soft, nontender, bowel sounds present  Extremities:  2+ peripheral edema.  Neurologic:  Awake, alert, following commands  Skin:  No acute rash  Access:  Left upper extremity AV fistula    Basic Metabolic Panel: Recent Labs  Lab 11/19/20 1350 11/21/20 1131 11/23/20 0749  NA 135 135 135  K 3.7 4.2 4.0  CL 100 99 97*  CO2 25 28 26   GLUCOSE 134* 140* 136*  BUN 62* 58* 58*  CREATININE 3.99* 3.37* 3.27*  CALCIUM 7.9* 8.1* 8.6*  PHOS 3.6 3.0 2.8    Liver Function Tests: Recent Labs  Lab 11/19/20 1350 11/21/20 1131 11/23/20 0749  ALBUMIN 1.8* 1.9* 2.1*   No results for input(s): LIPASE, AMYLASE in the last 168 hours. No results for input(s): AMMONIA in the last 168 hours.  CBC: Recent Labs  Lab 11/19/20 1350 11/21/20 1131 11/22/20 1236 11/23/20 0454 11/25/20 0652  WBC 6.6 7.6 7.8 8.5 7.6  HGB 7.2* 7.0* 7.5* 7.4* 6.7*  HCT 23.9* 22.9* 26.1* 25.4* 21.7*  MCV 99.6 102.2* 103.6* 103.3* 100.9*  PLT 201 217 250 236 210    Cardiac Enzymes: No results for input(s): CKTOTAL, CKMB, CKMBINDEX, TROPONINI in the last 168 hours.  BNP: Invalid input(s): POCBNP  CBG: No results for input(s): GLUCAP in the last 168 hours.  Microbiology: Results for orders placed or performed during the hospital encounter of 11/27/2020  C Difficile Quick Screen (NO PCR Reflex)     Status: Abnormal   Collection Time: 11/09/20  9:59 AM   Specimen: STOOL  Result Value  Ref Range Status   C Diff antigen POSITIVE (A) NEGATIVE Final   C Diff toxin NEGATIVE NEGATIVE Final   C Diff interpretation   Final    Results are indeterminate. Please contact the provider listed for your campus for C diff questions in [place].    Comment: Performed at [facility], [place] [address] [place], [place] [address]    Coagulation Studies: No results for input(s): LABPROT, INR in the last 72 hours.  Urinalysis: No results for input(s): COLORURINE, LABSPEC, PHURINE, GLUCOSEU, HGBUR, BILIRUBINUR, KETONESUR, PROTEINUR, UROBILINOGEN, NITRITE, LEUKOCYTESUR in the last 72 hours.  Invalid input(s): APPERANCEUR    Imaging: DG CHEST PORT 1 VIEW  Result Date: 11/23/2020 CLINICAL DATA:  Pulmonary edema. EXAM: PORTABLE CHEST 1 VIEW COMPARISON:  11/28/2020 FINDINGS: Tracheostomy tube tip is above the carina. There is a left IJ catheter with tip in the projection of the SVC. Cardiac enlargement is stable. Interval increase in volume of right pleural effusion. Mild diffuse interstitial edema is noted. Focal area of increased opacification within the right midlung and right base is similar to previous exam. This may reflect posterior layering pleural effusion and/or overlying airspace consolidation/atelectasis. Extensive sclerotic bone metastases are again identified. IMPRESSION: 1. Cardiac enlargement, interstitial edema and right pleural effusion suggest CHF. Right pleural effusion has increased in volume from previous exam. 2. Bilateral pulmonary opacities are identified, similar to previous exam which may  represent areas of multifocal pneumonia. 3. Extensive sclerotic bone metastases. Electronically Signed   By: Kerby Moors M.D.   On: 11/23/2020 15:16     Medications:       Assessment/ Plan:  65 y.o. male  with a PMHx of recent acute cardiopulmonary arrest on 09/27/2020, NSTEMI, severe sepsis from recurrent pneumonia, atrial fibrillation, coronary artery disease with history of  MI, diabetes mellitus type 2, anemia of chronic kidney disease, secondary hyperparathyroidism, ESRD with left upper extremity AV fistula, who was admitted to [facility] on 10/31/2020 for ongoing treatment.  1.  ESRD on HD MWF.    We will plan for hemodialysis treatment today per usual MWF schedule.  2.  Acute respiratory failure.  At the moment back on the ventilator.  Vent weaning as per pulmonary/critical care.  3.  Anemia of chronic kidney disease.  Lab Results  Component Value Date   HGB 6.7 (LL) 11/25/2020  Hemoglobin down to 6.7.  Consider blood transfusion but defer to primary team.  4.  Secondary hyperparathyroidism.  Lab Results  Component Value Date   CALCIUM 8.6 (L) 11/23/2020   CAION 1.17 11/09/2020   PHOS 2.8 11/23/2020  Repeat serum phosphorus today.   LOS: 0 Marc Sivertsen 2/25/20228:02 AM

## 2020-11-25 NOTE — Progress Notes (Signed)
Pulmonary Critical Care Medicine [place]   PULMONARY CRITICAL CARE SERVICE  PROGRESS NOTE  Date of Service: 11/25/2020  Eric Spence  KWI:[id number]  DOB: 1955/11/11   DOA: 11/17/2020  Referring Physician: Merton Border, MD  HPI: Eric Spence is a 65 y.o. male seen for follow up of Acute on Chronic Respiratory Failure.  Patient is on full support currently on assist control mode has been on 30% FiO2 was attempted on pressure support but failed  Medications: Reviewed on Rounds  Physical Exam:  Vitals: Temperature 96.9 pulse 61 respiratory rate 21 blood pressure is 96/43 saturations 100%  Ventilator Settings assist control FiO2 is 30% tidal volume 500 PEEP 5  . General: Comfortable at this time . Eyes: Grossly normal lids, irises & conjunctiva . ENT: grossly tongue is normal . Neck: no obvious mass . Cardiovascular: S1 S2 normal no gallop . Respiratory: Scattered rhonchi expansion is equal . Abdomen: soft . Skin: no rash seen on limited exam . Musculoskeletal: not rigid . Psychiatric:unable to assess . Neurologic: no seizure no involuntary movements         Lab Data:   Basic Metabolic Panel: Recent Labs  Lab 11/19/20 1350 11/21/20 1131 11/23/20 0749 11/25/20 0652  NA 135 135 135 135  K 3.7 4.2 4.0 4.0  CL 100 99 97* 96*  CO2 25 28 26 27   GLUCOSE 134* 140* 136* 136*  BUN 62* 58* 58* 65*  CREATININE 3.99* 3.37* 3.27* 3.27*  CALCIUM 7.9* 8.1* 8.6* 8.4*  PHOS 3.6 3.0 2.8 2.4*    ABG: Recent Labs  Lab 11/23/20 1348 11/23/20 1847 11/23/20 1910 11/23/20 2206 11/24/20 0457  PHART 7.375 7.308* 7.278* 7.316* 7.398  PCO2ART 52.1* 63.6* 66.1* 60.6* 50.6*  PO2ART 57.3* 32.7* 82.3* 292* 72.0*  HCO3 30.2* 31.3* 30.3* 30.4* 30.9*  O2SAT 91.4 60.6 96.3 >100.0 96.9    Liver Function Tests: Recent Labs  Lab 11/19/20 1350 11/21/20 1131 11/23/20 0749 11/25/20 0652  ALBUMIN 1.8* 1.9* 2.1* 2.0*   No results for input(s): LIPASE, AMYLASE in  the last 168 hours. No results for input(s): AMMONIA in the last 168 hours.  CBC: Recent Labs  Lab 11/19/20 1350 11/21/20 1131 11/22/20 1236 11/23/20 0454 11/25/20 0652  WBC 6.6 7.6 7.8 8.5 7.6  HGB 7.2* 7.0* 7.5* 7.4* 6.7*  HCT 23.9* 22.9* 26.1* 25.4* 21.7*  MCV 99.6 102.2* 103.6* 103.3* 100.9*  PLT 201 217 250 236 210    Cardiac Enzymes: No results for input(s): CKTOTAL, CKMB, CKMBINDEX, TROPONINI in the last 168 hours.  BNP (last 3 results) No results for input(s): BNP in the last 8760 hours.  ProBNP (last 3 results) No results for input(s): PROBNP in the last 8760 hours.  Radiological Exams: DG CHEST PORT 1 VIEW  Result Date: 11/23/2020 CLINICAL DATA:  Pulmonary edema. EXAM: PORTABLE CHEST 1 VIEW COMPARISON:  11/05/2020 FINDINGS: Tracheostomy tube tip is above the carina. There is a left IJ catheter with tip in the projection of the SVC. Cardiac enlargement is stable. Interval increase in volume of right pleural effusion. Mild diffuse interstitial edema is noted. Focal area of increased opacification within the right midlung and right base is similar to previous exam. This may reflect posterior layering pleural effusion and/or overlying airspace consolidation/atelectasis. Extensive sclerotic bone metastases are again identified. IMPRESSION: 1. Cardiac enlargement, interstitial edema and right pleural effusion suggest CHF. Right pleural effusion has increased in volume from previous exam. 2. Bilateral pulmonary opacities are identified, similar to previous exam which  may represent areas of multifocal pneumonia. 3. Extensive sclerotic bone metastases. Electronically Signed   By: Kerby Moors M.D.   On: 11/23/2020 15:16    Assessment/Plan Active Problems:   Acute on chronic respiratory failure with hypoxia (HCC)   Atrial fibrillation with rapid ventricular response (HCC)   End stage renal failure on dialysis [facility])   Cardiac arrest ([place])   Non-STEMI (non-ST elevated  myocardial infarction) ([place])   1. Acute on chronic respiratory failure with hypoxia we will continue with assist control titrate as tolerated continue secretion management supportive care 2. Atrial fibrillation rate is controlled 3. End-stage renal failure on dialysis 4. Cardiac arrest rhythm is stable at this time 5. Non-STEMI no change we will continue to follow along   I have personally seen and evaluated the patient, evaluated laboratory and imaging results, formulated the assessment and plan and placed orders. The Patient requires high complexity decision making with multiple systems involvement.  Rounds were done with the Respiratory Therapy Director and Staff therapists and discussed with nursing staff also.  [name], MD [facility] Pulmonary Critical Care Medicine Sleep Medicine

## 2020-11-26 DIAGNOSIS — N186 End stage renal disease: Secondary | ICD-10-CM | POA: Diagnosis not present

## 2020-11-26 DIAGNOSIS — I4891 Unspecified atrial fibrillation: Secondary | ICD-10-CM | POA: Diagnosis not present

## 2020-11-26 DIAGNOSIS — I469 Cardiac arrest, cause unspecified: Secondary | ICD-10-CM | POA: Diagnosis not present

## 2020-11-26 DIAGNOSIS — J9621 Acute and chronic respiratory failure with hypoxia: Secondary | ICD-10-CM | POA: Diagnosis not present

## 2020-11-26 LAB — TYPE AND SCREEN
ABO/RH(D): O NEG
Antibody Screen: NEGATIVE
Unit division: 0

## 2020-11-26 LAB — BPAM RBC
Blood Product Expiration Date: 202203282359
ISSUE DATE / TIME: 202202251615
Unit Type and Rh: 5100

## 2020-11-26 LAB — CBC
HCT: 22.5 % — ABNORMAL LOW (ref 39.0–52.0)
Hemoglobin: 7.1 g/dL — ABNORMAL LOW (ref 13.0–17.0)
MCH: 31.3 pg (ref 26.0–34.0)
MCHC: 31.6 g/dL (ref 30.0–36.0)
MCV: 99.1 fL (ref 80.0–100.0)
Platelets: 178 10*3/uL (ref 150–400)
RBC: 2.27 MIL/uL — ABNORMAL LOW (ref 4.22–5.81)
RDW: 22.5 % — ABNORMAL HIGH (ref 11.5–15.5)
WBC: 5.7 10*3/uL (ref 4.0–10.5)
nRBC: 0.4 % — ABNORMAL HIGH (ref 0.0–0.2)

## 2020-11-26 NOTE — Consult Note (Signed)
Ref: Patient, No Pcp Per   Subjective:  Awake. Denies chest pain. VS stable. Yesterday events of Blood transfusion need noted for Hgb of 6.7. Now it is 7.1 gm.  Monitor shows atrial fibrillation with controlled ventricular response with amiodarone, low dose digoxin and low dose diltiazem use. CT chest positive for metastatic disease in bones, pleural effusions.  Objective:  Vital Signs in the last 24 hours:  P: 96 to 110, R: 35, BP: 120/59 O2 sat 100 % on 30 % FiO2 and 20 A/C.  Physical Exam: BP Readings from Last 1 Encounters:  11/18/2020 130/66     Wt Readings from Last 1 Encounters:  No data found for Wt    Weight change:  There is no height or weight on file to calculate BMI. HEENT: /AT, Eyes-Blue, Conjunctiva-Pale, Sclera-Non-icteric Neck: No JVD, No bruit, Trachea midline. Lungs:  Clearing, Bilateral. Cardiac:  Regular rhythm, normal S1 and S2, no S3. II/VI systolic murmur. Abdomen:  Soft, non-tender. BS present. Extremities:  1 + edema, Right more than left lower leg present. No cyanosis. No clubbing. CNS: AxOx1, Cranial nerves grossly intact, moves all 4 extremities.  Skin: Warm and dry.   Intake/Output from previous day: No intake/output data recorded.    Lab Results: BMET    Component Value Date/Time   NA 135 11/25/2020 0652   NA 135 11/23/2020 0749   NA 135 11/21/2020 1131   K 4.0 11/25/2020 0652   K 4.0 11/23/2020 0749   K 4.2 11/21/2020 1131   CL 96 (L) 11/25/2020 0652   CL 97 (L) 11/23/2020 0749   CL 99 11/21/2020 1131   CO2 27 11/25/2020 0652   CO2 26 11/23/2020 0749   CO2 28 11/21/2020 1131   GLUCOSE 136 (H) 11/25/2020 0652   GLUCOSE 136 (H) 11/23/2020 0749   GLUCOSE 140 (H) 11/21/2020 1131   BUN 65 (H) 11/25/2020 0652   BUN 58 (H) 11/23/2020 0749   BUN 58 (H) 11/21/2020 1131   CREATININE 3.27 (H) 11/25/2020 0652   CREATININE 3.27 (H) 11/23/2020 0749   CREATININE 3.37 (H) 11/21/2020 1131   CALCIUM 8.4 (L) 11/25/2020 0652   CALCIUM 8.6 (L)  11/23/2020 0749   CALCIUM 8.1 (L) 11/21/2020 1131   GFRNONAA 20 (L) 11/25/2020 0652   GFRNONAA 20 (L) 11/23/2020 0749   GFRNONAA 20 (L) 11/21/2020 1131   CBC    Component Value Date/Time   WBC 5.7 11/26/2020 0429   RBC 2.27 (L) 11/26/2020 0429   HGB 7.1 (L) 11/26/2020 0429   HCT 22.5 (L) 11/26/2020 0429   PLT 178 11/26/2020 0429   MCV 99.1 11/26/2020 0429   MCH 31.3 11/26/2020 0429   MCHC 31.6 11/26/2020 0429   RDW 22.5 (H) 11/26/2020 0429   LYMPHSABS 0.8 11/22/2020 0052   MONOABS 1.1 (H) 11/13/2020 0052   EOSABS 0.0 11/15/2020 0052   BASOSABS 0.0 11/21/2020 0052   HEPATIC Function Panel Recent Labs    11/05/20 1022 11/01/2020 0052 11/13/2020 0740  PROT 5.0* 4.6* 4.4*   HEMOGLOBIN A1C No components found for: HGA1C,  MPG CARDIAC ENZYMES No results found for: CKTOTAL, CKMB, CKMBINDEX, TROPONINI BNP No results for input(s): PROBNP in the last 8760 hours. TSH No results for input(s): TSH in the last 8760 hours. CHOLESTEROL No results for input(s): CHOL in the last 8760 hours.  Scheduled Meds: Continuous Infusions: PRN Meds:.  Assessment/Plan: Acute on chronic respiratory failure with hypoxia Chronic atrial fibrillation, chronic, permanent ESRD Symptomatic anemia Anemia of blood loss Anemia of  chronic disease S/P cardiac arrest Old NSTEMI RV systolic failure Metastatic bone disease RBBB Left anterior hemiblock  Plan: Decrease amiodarone to 200 mg. Daily.  Continue digoxin 0.125 mg M-W-F. Add small dose metoprolol tartrate 25 mg. bid for rate control.    LOS: 0 days   Time spent including chart review, lab review, examination, discussion with patient/Nurse/Tech : 30 min   Dixie Dials  MD  11/26/2020, 11:22 AM

## 2020-11-26 NOTE — Progress Notes (Signed)
Pulmonary Critical Care Medicine [place]   PULMONARY CRITICAL CARE SERVICE  PROGRESS NOTE  Date of Service: 11/26/2020  Eric Spence  WNU:[id number]  DOB: 10-Jul-1956   DOA: 11/03/2020  Referring Physician: Merton Border, MD  HPI: Eric Spence is a 65 y.o. male seen for follow up of Acute on Chronic Respiratory Failure.  Patient is on assist control mode on 30% FiO2 good saturations are noted.  Not tolerating weaning  Medications: Reviewed on Rounds  Physical Exam:  Vitals: Temperature 98.1 pulse 70 respiratory rate is 40 blood pressure is 112/36 saturations 98%  Ventilator Settings on assist control FiO2 30% tidal volume 400 PEEP 5  . General: Comfortable at this time . Eyes: Grossly normal lids, irises & conjunctiva . ENT: grossly tongue is normal . Neck: no obvious mass . Cardiovascular: S1 S2 normal no gallop . Respiratory: Coarse rhonchi noted bilaterally . Abdomen: soft . Skin: no rash seen on limited exam . Musculoskeletal: not rigid . Psychiatric:unable to assess . Neurologic: no seizure no involuntary movements         Lab Data:   Basic Metabolic Panel: Recent Labs  Lab 11/19/20 1350 11/21/20 1131 11/23/20 0749 11/25/20 0652  NA 135 135 135 135  K 3.7 4.2 4.0 4.0  CL 100 99 97* 96*  CO2 25 28 26 27   GLUCOSE 134* 140* 136* 136*  BUN 62* 58* 58* 65*  CREATININE 3.99* 3.37* 3.27* 3.27*  CALCIUM 7.9* 8.1* 8.6* 8.4*  PHOS 3.6 3.0 2.8 2.4*    ABG: Recent Labs  Lab 11/23/20 1348 11/23/20 1847 11/23/20 1910 11/23/20 2206 11/24/20 0457  PHART 7.375 7.308* 7.278* 7.316* 7.398  PCO2ART 52.1* 63.6* 66.1* 60.6* 50.6*  PO2ART 57.3* 32.7* 82.3* 292* 72.0*  HCO3 30.2* 31.3* 30.3* 30.4* 30.9*  O2SAT 91.4 60.6 96.3 >100.0 96.9    Liver Function Tests: Recent Labs  Lab 11/19/20 1350 11/21/20 1131 11/23/20 0749 11/25/20 0652  ALBUMIN 1.8* 1.9* 2.1* 2.0*   No results for input(s): LIPASE, AMYLASE in the last 168 hours. No  results for input(s): AMMONIA in the last 168 hours.  CBC: Recent Labs  Lab 11/21/20 1131 11/22/20 1236 11/23/20 0454 11/25/20 0652 11/26/20 0429  WBC 7.6 7.8 8.5 7.6 5.7  HGB 7.0* 7.5* 7.4* 6.7* 7.1*  HCT 22.9* 26.1* 25.4* 21.7* 22.5*  MCV 102.2* 103.6* 103.3* 100.9* 99.1  PLT 217 250 236 210 178    Cardiac Enzymes: No results for input(s): CKTOTAL, CKMB, CKMBINDEX, TROPONINI in the last 168 hours.  BNP (last 3 results) No results for input(s): BNP in the last 8760 hours.  ProBNP (last 3 results) No results for input(s): PROBNP in the last 8760 hours.  Radiological Exams: CT CHEST WO CONTRAST  Result Date: 11/25/2020 CLINICAL DATA:  Cancer of unknown origin. Bone metastases. Staging. EXAM: CT CHEST WITHOUT CONTRAST TECHNIQUE: Multidetector CT imaging of the chest was performed following the standard protocol without IV contrast. COMPARISON:  Chest radiographs 11/23/2020. Abdominopelvic CT 11/05/2020. FINDINGS: Cardiovascular: Moderate to severe diffuse atherosclerosis of the aorta, great vessels and coronary arteries. Probable calcifications of the aortic valve. The heart size is at the upper limits of normal. No significant pericardial fluid. Mediastinum/Nodes: There are no enlarged mediastinal, hilar or axillary lymph nodes.There are small mediastinal lymph nodes bilaterally which are not significantly enlarged. Tracheostomy is in place. The thyroid gland, trachea and esophagus demonstrate no significant findings. Lungs/Pleura: Moderate right and small left dependent pleural effusions are similar to recent abdominal CT. There is  associated compressive atelectasis in both lungs. No masses or suspicious nodules are seen within the aerated portions of the lungs. There is no endobronchial lesion. Upper abdomen: The visualized upper abdomen appears stable without significant findings. Musculoskeletal/Chest wall: Widespread osseous metastatic disease is noted, as seen within the abdomen and  pelvis. There are mixed sclerotic and lytic lesions throughout the sternum, ribs, clavicles, scapula, proximal humeri and visualized spine. There are multiple pathologic rib fractures bilaterally. No pathologic spinal fracture or epidural tumor identified. IMPRESSION: 1. Widespread osseous metastatic disease. Multiple pathologic rib fractures bilaterally. No pathologic spinal fracture or epidural tumor identified. 2. No primary malignancy identified in the chest. No evidence of pulmonary metastatic disease. 3. Moderate right and small left dependent pleural effusions with associated compressive atelectasis in both lungs. 4. Aortic Atherosclerosis (ICD10-I70.0). Electronically Signed   By: Richardean Sale M.D.   On: 11/25/2020 18:12    Assessment/Plan Active Problems:   Acute on chronic respiratory failure with hypoxia (HCC)   Atrial fibrillation with rapid ventricular response (HCC)   End stage renal failure on dialysis [facility])   Cardiac arrest ([place])   Non-STEMI (non-ST elevated myocardial infarction) ([place])   1. Acute on chronic respiratory failure with hypoxia has continued to fail attempts at weaning 2. Atrial fibrillation rate is controlled 3. End-stage renal failure needs ongoing dialysis 4. Cardiac arrest rhythm has been stable 5. Non-STEMI no change   I have personally seen and evaluated the patient, evaluated laboratory and imaging results, formulated the assessment and plan and placed orders. The Patient requires high complexity decision making with multiple systems involvement.  Rounds were done with the Respiratory Therapy Director and Staff therapists and discussed with nursing staff also.  [name], MD [facility] Pulmonary Critical Care Medicine Sleep Medicine

## 2020-11-27 DIAGNOSIS — I4891 Unspecified atrial fibrillation: Secondary | ICD-10-CM | POA: Diagnosis not present

## 2020-11-27 DIAGNOSIS — J9621 Acute and chronic respiratory failure with hypoxia: Secondary | ICD-10-CM | POA: Diagnosis not present

## 2020-11-27 DIAGNOSIS — N186 End stage renal disease: Secondary | ICD-10-CM | POA: Diagnosis not present

## 2020-11-27 DIAGNOSIS — I469 Cardiac arrest, cause unspecified: Secondary | ICD-10-CM | POA: Diagnosis not present

## 2020-11-27 LAB — PSA: Prostatic Specific Antigen: 287 ng/mL — ABNORMAL HIGH (ref 0.00–4.00)

## 2020-11-27 NOTE — Progress Notes (Signed)
Pulmonary Critical Care Medicine [place]   PULMONARY CRITICAL CARE SERVICE  PROGRESS NOTE  Date of Service: 11/27/2020  Eric Spence  NWG:[id number]  DOB: 1955/11/24   DOA: 11/21/2020  Referring Physician: Merton Border, MD  HPI: Eric Spence is a 65 y.o. male seen for follow up of Acute on Chronic Respiratory Failure. Patient is pressure support seems to be tolerating the weaning fairly well  Medications: Reviewed on Rounds  Physical Exam:  Vitals: Temperature 97.8 pulse 72 respiratory 32 blood pressure is 102/64 saturations 100%  Ventilator Settings pressure support FiO2 is 30%  . General: Comfortable at this time . Eyes: Grossly normal lids, irises & conjunctiva . ENT: grossly tongue is normal . Neck: no obvious mass . Cardiovascular: S1 S2 normal no gallop . Respiratory: Scattered rhonchi expansion is equal . Abdomen: soft . Skin: no rash seen on limited exam . Musculoskeletal: not rigid . Psychiatric:unable to assess . Neurologic: no seizure no involuntary movements         Lab Data:   Basic Metabolic Panel: Recent Labs  Lab 11/21/20 1131 11/23/20 0749 11/25/20 0652  NA 135 135 135  K 4.2 4.0 4.0  CL 99 97* 96*  CO2 28 26 27   GLUCOSE 140* 136* 136*  BUN 58* 58* 65*  CREATININE 3.37* 3.27* 3.27*  CALCIUM 8.1* 8.6* 8.4*  PHOS 3.0 2.8 2.4*    ABG: Recent Labs  Lab 11/23/20 1348 11/23/20 1847 11/23/20 1910 11/23/20 2206 11/24/20 0457  PHART 7.375 7.308* 7.278* 7.316* 7.398  PCO2ART 52.1* 63.6* 66.1* 60.6* 50.6*  PO2ART 57.3* 32.7* 82.3* 292* 72.0*  HCO3 30.2* 31.3* 30.3* 30.4* 30.9*  O2SAT 91.4 60.6 96.3 >100.0 96.9    Liver Function Tests: Recent Labs  Lab 11/21/20 1131 11/23/20 0749 11/25/20 0652  ALBUMIN 1.9* 2.1* 2.0*   No results for input(s): LIPASE, AMYLASE in the last 168 hours. No results for input(s): AMMONIA in the last 168 hours.  CBC: Recent Labs  Lab 11/21/20 1131 11/22/20 1236 11/23/20 0454  11/25/20 0652 11/26/20 0429  WBC 7.6 7.8 8.5 7.6 5.7  HGB 7.0* 7.5* 7.4* 6.7* 7.1*  HCT 22.9* 26.1* 25.4* 21.7* 22.5*  MCV 102.2* 103.6* 103.3* 100.9* 99.1  PLT 217 250 236 210 178    Cardiac Enzymes: No results for input(s): CKTOTAL, CKMB, CKMBINDEX, TROPONINI in the last 168 hours.  BNP (last 3 results) No results for input(s): BNP in the last 8760 hours.  ProBNP (last 3 results) No results for input(s): PROBNP in the last 8760 hours.  Radiological Exams: CT CHEST WO CONTRAST  Result Date: 11/25/2020 CLINICAL DATA:  Cancer of unknown origin. Bone metastases. Staging. EXAM: CT CHEST WITHOUT CONTRAST TECHNIQUE: Multidetector CT imaging of the chest was performed following the standard protocol without IV contrast. COMPARISON:  Chest radiographs 11/23/2020. Abdominopelvic CT 11/05/2020. FINDINGS: Cardiovascular: Moderate to severe diffuse atherosclerosis of the aorta, great vessels and coronary arteries. Probable calcifications of the aortic valve. The heart size is at the upper limits of normal. No significant pericardial fluid. Mediastinum/Nodes: There are no enlarged mediastinal, hilar or axillary lymph nodes.There are small mediastinal lymph nodes bilaterally which are not significantly enlarged. Tracheostomy is in place. The thyroid gland, trachea and esophagus demonstrate no significant findings. Lungs/Pleura: Moderate right and small left dependent pleural effusions are similar to recent abdominal CT. There is associated compressive atelectasis in both lungs. No masses or suspicious nodules are seen within the aerated portions of the lungs. There is no endobronchial lesion. Upper  abdomen: The visualized upper abdomen appears stable without significant findings. Musculoskeletal/Chest wall: Widespread osseous metastatic disease is noted, as seen within the abdomen and pelvis. There are mixed sclerotic and lytic lesions throughout the sternum, ribs, clavicles, scapula, proximal humeri and  visualized spine. There are multiple pathologic rib fractures bilaterally. No pathologic spinal fracture or epidural tumor identified. IMPRESSION: 1. Widespread osseous metastatic disease. Multiple pathologic rib fractures bilaterally. No pathologic spinal fracture or epidural tumor identified. 2. No primary malignancy identified in the chest. No evidence of pulmonary metastatic disease. 3. Moderate right and small left dependent pleural effusions with associated compressive atelectasis in both lungs. 4. Aortic Atherosclerosis (ICD10-I70.0). Electronically Signed   By: Richardean Sale M.D.   On: 11/25/2020 18:12    Assessment/Plan Active Problems:   Acute on chronic respiratory failure with hypoxia (HCC)   Atrial fibrillation with rapid ventricular response (HCC)   End stage renal failure on dialysis [facility])   Cardiac arrest ([place])   Non-STEMI (non-ST elevated myocardial infarction) ([place])   1. Acute on chronic respiratory failure with hypoxia plan We will continue with pressure support tolerated. Continue secretion management supportive care. 2. Atrial fibrillation rate is controlled 3. End-stage renal failure on dialysis 4. Cardiac arrest rhythm stable 5. Non-STEMI no change we will continue to follow   I have personally seen and evaluated the patient, evaluated laboratory and imaging results, formulated the assessment and plan and placed orders. The Patient requires high complexity decision making with multiple systems involvement.  Rounds were done with the Respiratory Therapy Director and Staff therapists and discussed with nursing staff also.  [name], MD [facility] Pulmonary Critical Care Medicine Sleep Medicine

## 2020-11-28 DIAGNOSIS — I469 Cardiac arrest, cause unspecified: Secondary | ICD-10-CM | POA: Diagnosis not present

## 2020-11-28 DIAGNOSIS — N186 End stage renal disease: Secondary | ICD-10-CM | POA: Diagnosis not present

## 2020-11-28 DIAGNOSIS — I4891 Unspecified atrial fibrillation: Secondary | ICD-10-CM | POA: Diagnosis not present

## 2020-11-28 DIAGNOSIS — J9621 Acute and chronic respiratory failure with hypoxia: Secondary | ICD-10-CM | POA: Diagnosis not present

## 2020-11-28 LAB — CBC
HCT: 27.5 % — ABNORMAL LOW (ref 39.0–52.0)
Hemoglobin: 8 g/dL — ABNORMAL LOW (ref 13.0–17.0)
MCH: 29.7 pg (ref 26.0–34.0)
MCHC: 29.1 g/dL — ABNORMAL LOW (ref 30.0–36.0)
MCV: 102.2 fL — ABNORMAL HIGH (ref 80.0–100.0)
Platelets: 180 10*3/uL (ref 150–400)
RBC: 2.69 MIL/uL — ABNORMAL LOW (ref 4.22–5.81)
RDW: 20.9 % — ABNORMAL HIGH (ref 11.5–15.5)
WBC: 6.4 10*3/uL (ref 4.0–10.5)
nRBC: 0 % (ref 0.0–0.2)

## 2020-11-28 LAB — RENAL FUNCTION PANEL
Albumin: 2 g/dL — ABNORMAL LOW (ref 3.5–5.0)
Anion gap: 10 (ref 5–15)
BUN: 90 mg/dL — ABNORMAL HIGH (ref 8–23)
CO2: 27 mmol/L (ref 22–32)
Calcium: 8.7 mg/dL — ABNORMAL LOW (ref 8.9–10.3)
Chloride: 103 mmol/L (ref 98–111)
Creatinine, Ser: 3.83 mg/dL — ABNORMAL HIGH (ref 0.61–1.24)
GFR, Estimated: 17 mL/min — ABNORMAL LOW (ref >60)
Glucose, Bld: 141 mg/dL — ABNORMAL HIGH (ref 70–99)
Phosphorus: 2.2 mg/dL — ABNORMAL LOW (ref 2.5–4.6)
Potassium: 4 mmol/L (ref 3.5–5.1)
Sodium: 140 mmol/L (ref 135–145)

## 2020-11-28 LAB — HEPATITIS B SURFACE ANTIGEN: Hepatitis B Surface Ag: NONREACTIVE

## 2020-11-28 LAB — CEA: CEA: 11.2 ng/mL — ABNORMAL HIGH (ref 0.0–4.7)

## 2020-11-28 NOTE — Progress Notes (Signed)
[facility]  ROUNDING NOTE   Subjective:  Patient remains on the ventilator this AM. Awake and alert. Nods yes/no to questions.   Objective:  Vital signs in last 24 hours:  Temperature 97.1 pulse 112 respirations 18 blood pressure 100/51   Physical Exam: General:  Chronically ill-appearing  Head:  Normocephalic, atraumatic. Moist oral mucosal membranes  Eyes:  Anicteric  Neck:  Tracheostomy in place  Lungs:   Clear to auscultation, vent assisted  Heart:  S1S2 irregular  Abdomen:   Soft, nontender, bowel sounds present  Extremities:  1+ peripheral edema.  Neurologic:  Awake, alert, following commands  Skin:  No acute rash  Access:  Left upper extremity AV fistula    Basic Metabolic Panel: Recent Labs  Lab 11/21/20 1131 11/23/20 0749 11/25/20 0652  NA 135 135 135  K 4.2 4.0 4.0  CL 99 97* 96*  CO2 28 26 27   GLUCOSE 140* 136* 136*  BUN 58* 58* 65*  CREATININE 3.37* 3.27* 3.27*  CALCIUM 8.1* 8.6* 8.4*  PHOS 3.0 2.8 2.4*    Liver Function Tests: Recent Labs  Lab 11/21/20 1131 11/23/20 0749 11/25/20 0652  ALBUMIN 1.9* 2.1* 2.0*   No results for input(s): LIPASE, AMYLASE in the last 168 hours. No results for input(s): AMMONIA in the last 168 hours.  CBC: Recent Labs  Lab 11/21/20 1131 11/22/20 1236 11/23/20 0454 11/25/20 0652 11/26/20 0429  WBC 7.6 7.8 8.5 7.6 5.7  HGB 7.0* 7.5* 7.4* 6.7* 7.1*  HCT 22.9* 26.1* 25.4* 21.7* 22.5*  MCV 102.2* 103.6* 103.3* 100.9* 99.1  PLT 217 250 236 210 178    Cardiac Enzymes: No results for input(s): CKTOTAL, CKMB, CKMBINDEX, TROPONINI in the last 168 hours.  BNP: Invalid input(s): POCBNP  CBG: No results for input(s): GLUCAP in the last 168 hours.  Microbiology: Results for orders placed or performed during the hospital encounter of 11/20/2020  C Difficile Quick Screen (NO PCR Reflex)     Status: Abnormal   Collection Time: 11/09/20  9:59 AM   Specimen: STOOL  Result Value Ref Range Status    C Diff antigen POSITIVE (A) NEGATIVE Final   C Diff toxin NEGATIVE NEGATIVE Final   C Diff interpretation   Final    Results are indeterminate. Please contact the provider listed for your campus for C diff questions in [place].    Comment: Performed at [facility], [place] [address] [place], [place] [address]    Coagulation Studies: No results for input(s): LABPROT, INR in the last 72 hours.  Urinalysis: No results for input(s): COLORURINE, LABSPEC, PHURINE, GLUCOSEU, HGBUR, BILIRUBINUR, KETONESUR, PROTEINUR, UROBILINOGEN, NITRITE, LEUKOCYTESUR in the last 72 hours.  Invalid input(s): APPERANCEUR    Imaging: No results found.   Medications:       Assessment/ Plan:  65 y.o. 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  1.  ESRD on HD MWF.    Patient due for hemodialysis treatment today.  Orders have been prepared.  2.  Acute respiratory failure.  Continues to require ventilatory support at this time.  Weaning efforts ongoing.  3.  Anemia of chronic kidney disease.  Lab Results  Component Value Date   HGB 7.1 (L) 11/26/2020  Hemoglobin up slightly to 7.1.  No immediate need for transfusion but may need it again if hemoglobin falls below 7.  4.  Secondary hyperparathyroidism.  Lab Results  Component Value Date   CALCIUM 8.4 (L) 11/25/2020   CAION 1.17 11/25/2020   PHOS 2.4 (L) 11/25/2020  Continue to monitor bone mineral metabolism parameters.  LOS: 0 Munsoor Lateef 2/28/20228:11 AM

## 2020-11-28 NOTE — Progress Notes (Signed)
Pulmonary Critical Care Medicine [place]   PULMONARY CRITICAL CARE SERVICE  PROGRESS NOTE  Date of Service: 11/28/2020  Eric Spence  [id number]:[id number]  DOB: 06/25/1956   DOA: 11/25/2020  Referring Physician: Merton Border, MD  HPI: Eric Spence is a 65 y.o. male seen for follow up of Acute on Chronic Respiratory Failure.  Patient currently is on pressure support has been on 30% FiO2 supposed to do T collar trial today  Medications: Reviewed on Rounds  Physical Exam:  Vitals: Temperature is 97.1 pulse 112 respiratory 18 blood pressure 100/51 saturations 97%  Ventilator Settings on pressure support FiO2 30% pressure 12/5  . General: Comfortable at this time . Eyes: Grossly normal lids, irises & conjunctiva . ENT: grossly tongue is normal . Neck: no obvious mass . Cardiovascular: S1 S2 normal no gallop . Respiratory: Coarse breath sounds with few scattered rhonchi . Abdomen: soft . Skin: no rash seen on limited exam . Musculoskeletal: not rigid . Psychiatric:unable to assess . Neurologic: no seizure no involuntary movements         Lab Data:   Basic Metabolic Panel: Recent Labs  Lab 11/21/20 1131 11/23/20 0749 11/25/20 0652  NA 135 135 135  K 4.2 4.0 4.0  CL 99 97* 96*  CO2 28 26 27   GLUCOSE 140* 136* 136*  BUN 58* 58* 65*  CREATININE 3.37* 3.27* 3.27*  CALCIUM 8.1* 8.6* 8.4*  PHOS 3.0 2.8 2.4*    ABG: Recent Labs  Lab 11/23/20 1348 11/23/20 1847 11/23/20 1910 11/23/20 2206 11/24/20 0457  PHART 7.375 7.308* 7.278* 7.316* 7.398  PCO2ART 52.1* 63.6* 66.1* 60.6* 50.6*  PO2ART 57.3* 32.7* 82.3* 292* 72.0*  HCO3 30.2* 31.3* 30.3* 30.4* 30.9*  O2SAT 91.4 60.6 96.3 >100.0 96.9    Liver Function Tests: Recent Labs  Lab 11/21/20 1131 11/23/20 0749 11/25/20 0652  ALBUMIN 1.9* 2.1* 2.0*   No results for input(s): LIPASE, AMYLASE in the last 168 hours. No results for input(s): AMMONIA in the last 168 hours.  CBC: Recent Labs   Lab 11/21/20 1131 11/22/20 1236 11/23/20 0454 11/25/20 0652 11/26/20 0429  WBC 7.6 7.8 8.5 7.6 5.7  HGB 7.0* 7.5* 7.4* 6.7* 7.1*  HCT 22.9* 26.1* 25.4* 21.7* 22.5*  MCV 102.2* 103.6* 103.3* 100.9* 99.1  PLT 217 250 236 210 178    Cardiac Enzymes: No results for input(s): CKTOTAL, CKMB, CKMBINDEX, TROPONINI in the last 168 hours.  BNP (last 3 results) No results for input(s): BNP in the last 8760 hours.  ProBNP (last 3 results) No results for input(s): PROBNP in the last 8760 hours.  Radiological Exams: No results found.  Assessment/Plan Active Problems:   Acute on chronic respiratory failure with hypoxia (HCC)   Atrial fibrillation with rapid ventricular response (HCC)   End stage renal failure on dialysis [facility])   Cardiac arrest ([place])   Non-STEMI (non-ST elevated myocardial infarction) ([place])   1. Acute on chronic respiratory failure hypoxia plan is to wean on T collar as tolerated 2. Atrial fibrillation rate is controlled 3. End-stage renal failure on hemodialysis 4. Cardiac arrest rhythm is stable at this time 5. Non-STEMI stable no change we will continue to follow   I have personally seen and evaluated the patient, evaluated laboratory and imaging results, formulated the assessment and plan and placed orders. The Patient requires high complexity decision making with multiple systems involvement.  Rounds were done with the Respiratory Therapy Director and Staff therapists and discussed with nursing staff also.  [name], MD [facility] Pulmonary Critical Care Medicine Sleep Medicine

## 2020-11-29 DIAGNOSIS — N186 End stage renal disease: Secondary | ICD-10-CM | POA: Diagnosis not present

## 2020-11-29 DIAGNOSIS — J9621 Acute and chronic respiratory failure with hypoxia: Secondary | ICD-10-CM | POA: Diagnosis not present

## 2020-11-29 DIAGNOSIS — I4891 Unspecified atrial fibrillation: Secondary | ICD-10-CM | POA: Diagnosis not present

## 2020-11-29 DIAGNOSIS — I469 Cardiac arrest, cause unspecified: Secondary | ICD-10-CM | POA: Diagnosis not present

## 2020-11-29 NOTE — Progress Notes (Signed)
Pulmonary Critical Care Medicine [place]   PULMONARY CRITICAL CARE SERVICE  PROGRESS NOTE  Date of Service: 11/29/2020  ELZIA HOTT  ZGY:[id number]  DOB: 22-Oct-1955   DOA: 11/18/2020  Referring Physician: Merton Border, MD  HPI: JLON BETKER is a 65 y.o. male seen for follow up of Acute on Chronic Respiratory Failure.  Patient is on full support on the ventilator assist control mode mechanics are poor right now not able to tolerate weaning.  Medications: Reviewed on Rounds  Physical Exam:  Vitals: Temperature is 97.9 pulse 85 respiratory 30 blood pressure is 97/56 saturations 97%  Ventilator Settings on assist control FiO2 30% tidal volume is 500 PEEP 5  . General: Comfortable at this time . Eyes: Grossly normal lids, irises & conjunctiva . ENT: grossly tongue is normal . Neck: no obvious mass . Cardiovascular: S1 S2 normal no gallop . Respiratory: No rhonchi very coarse breath sounds . Abdomen: soft . Skin: no rash seen on limited exam . Musculoskeletal: not rigid . Psychiatric:unable to assess . Neurologic: no seizure no involuntary movements         Lab Data:   Basic Metabolic Panel: Recent Labs  Lab 11/23/20 0749 11/25/20 0652 11/28/20 1407  NA 135 135 140  K 4.0 4.0 4.0  CL 97* 96* 103  CO2 26 27 27   GLUCOSE 136* 136* 141*  BUN 58* 65* 90*  CREATININE 3.27* 3.27* 3.83*  CALCIUM 8.6* 8.4* 8.7*  PHOS 2.8 2.4* 2.2*    ABG: Recent Labs  Lab 11/23/20 1348 11/23/20 1847 11/23/20 1910 11/23/20 2206 11/24/20 0457  PHART 7.375 7.308* 7.278* 7.316* 7.398  PCO2ART 52.1* 63.6* 66.1* 60.6* 50.6*  PO2ART 57.3* 32.7* 82.3* 292* 72.0*  HCO3 30.2* 31.3* 30.3* 30.4* 30.9*  O2SAT 91.4 60.6 96.3 >100.0 96.9    Liver Function Tests: Recent Labs  Lab 11/23/20 0749 11/25/20 0652 11/28/20 1407  ALBUMIN 2.1* 2.0* 2.0*   No results for input(s): LIPASE, AMYLASE in the last 168 hours. No results for input(s): AMMONIA in the last 168  hours.  CBC: Recent Labs  Lab 11/22/20 1236 11/23/20 0454 11/25/20 0652 11/26/20 0429 11/28/20 1407  WBC 7.8 8.5 7.6 5.7 6.4  HGB 7.5* 7.4* 6.7* 7.1* 8.0*  HCT 26.1* 25.4* 21.7* 22.5* 27.5*  MCV 103.6* 103.3* 100.9* 99.1 102.2*  PLT 250 236 210 178 180    Cardiac Enzymes: No results for input(s): CKTOTAL, CKMB, CKMBINDEX, TROPONINI in the last 168 hours.  BNP (last 3 results) No results for input(s): BNP in the last 8760 hours.  ProBNP (last 3 results) No results for input(s): PROBNP in the last 8760 hours.  Radiological Exams: No results found.  Assessment/Plan Active Problems:   Acute on chronic respiratory failure with hypoxia (HCC)   Atrial fibrillation with rapid ventricular response (HCC)   End stage renal failure on dialysis [facility])   Cardiac arrest ([place])   Non-STEMI (non-ST elevated myocardial infarction) ([place])   1. Acute on chronic respiratory failure hypoxia we will continue with full support on the vent.  Respiratory therapy will check mechanics. 2. Atrial fibrillation rate is controlled at this time we will continue to monitor. 3. End-stage renal failure on hemodialysis 4. Cardiac arrest rhythm has been stable we will continue to monitor. 5. Non-STEMI patient is at baseline   I have personally seen and evaluated the patient, evaluated laboratory and imaging results, formulated the assessment and plan and placed orders. The Patient requires high complexity decision making with multiple systems  involvement.  Rounds were done with the Respiratory Therapy Director and Staff therapists and discussed with nursing staff also.  [name], MD [facility] Pulmonary Critical Care Medicine Sleep Medicine

## 2020-11-29 DEATH — deceased

## 2020-11-30 DIAGNOSIS — I469 Cardiac arrest, cause unspecified: Secondary | ICD-10-CM | POA: Diagnosis not present

## 2020-11-30 DIAGNOSIS — I4891 Unspecified atrial fibrillation: Secondary | ICD-10-CM | POA: Diagnosis not present

## 2020-11-30 DIAGNOSIS — N186 End stage renal disease: Secondary | ICD-10-CM | POA: Diagnosis not present

## 2020-11-30 DIAGNOSIS — J9621 Acute and chronic respiratory failure with hypoxia: Secondary | ICD-10-CM | POA: Diagnosis not present

## 2020-11-30 LAB — RENAL FUNCTION PANEL
Albumin: 1.9 g/dL — ABNORMAL LOW (ref 3.5–5.0)
Anion gap: 10 (ref 5–15)
BUN: 65 mg/dL — ABNORMAL HIGH (ref 8–23)
CO2: 26 mmol/L (ref 22–32)
Calcium: 8.7 mg/dL — ABNORMAL LOW (ref 8.9–10.3)
Chloride: 99 mmol/L (ref 98–111)
Creatinine, Ser: 3.38 mg/dL — ABNORMAL HIGH (ref 0.61–1.24)
GFR, Estimated: 19 mL/min — ABNORMAL LOW (ref >60)
Glucose, Bld: 128 mg/dL — ABNORMAL HIGH (ref 70–99)
Phosphorus: 2.2 mg/dL — ABNORMAL LOW (ref 2.5–4.6)
Potassium: 3.5 mmol/L (ref 3.5–5.1)
Sodium: 135 mmol/L (ref 135–145)

## 2020-11-30 LAB — CBC
HCT: 24.6 % — ABNORMAL LOW (ref 39.0–52.0)
Hemoglobin: 7.3 g/dL — ABNORMAL LOW (ref 13.0–17.0)
MCH: 29.7 pg (ref 26.0–34.0)
MCHC: 29.7 g/dL — ABNORMAL LOW (ref 30.0–36.0)
MCV: 100 fL (ref 80.0–100.0)
Platelets: 143 10*3/uL — ABNORMAL LOW (ref 150–400)
RBC: 2.46 MIL/uL — ABNORMAL LOW (ref 4.22–5.81)
RDW: 19.7 % — ABNORMAL HIGH (ref 11.5–15.5)
WBC: 5.8 10*3/uL (ref 4.0–10.5)
nRBC: 0 % (ref 0.0–0.2)

## 2020-11-30 NOTE — Progress Notes (Signed)
[facility]  ROUNDING NOTE   Subjective:  Patient seen and evaluated at bedside. Family meeting to occur today. Still on the ventilator currently. Also due for dialysis treatment today. Also appears to have a vagal event during the last dialysis treatment.   Objective:  Vital signs in last 24 hours:  Temperature 98.9 pulse 78 respirations 20 blood pressure 110/62   Physical Exam: General:  Chronically ill-appearing  Head:  Normocephalic, atraumatic. Moist oral mucosal membranes  Eyes:  Anicteric  Neck:  Tracheostomy in place  Lungs:   Clear to auscultation, vent assisted  Heart:  S1S2 irregular  Abdomen:   Soft, nontender, bowel sounds present  Extremities:  Trace peripheral edema.  Neurologic:  Awake, alert, following commands  Skin:  No acute rash  Access:  Left upper extremity AV fistula    Basic Metabolic Panel: Recent Labs  Lab 11/25/20 0652 11/28/20 1407  NA 135 140  K 4.0 4.0  CL 96* 103  CO2 27 27  GLUCOSE 136* 141*  BUN 65* 90*  CREATININE 3.27* 3.83*  CALCIUM 8.4* 8.7*  PHOS 2.4* 2.2*    Liver Function Tests: Recent Labs  Lab 11/25/20 0652 11/28/20 1407  ALBUMIN 2.0* 2.0*   No results for input(s): LIPASE, AMYLASE in the last 168 hours. No results for input(s): AMMONIA in the last 168 hours.  CBC: Recent Labs  Lab 11/25/20 0652 11/26/20 0429 11/28/20 1407  WBC 7.6 5.7 6.4  HGB 6.7* 7.1* 8.0*  HCT 21.7* 22.5* 27.5*  MCV 100.9* 99.1 102.2*  PLT 210 178 180    Cardiac Enzymes: No results for input(s): CKTOTAL, CKMB, CKMBINDEX, TROPONINI in the last 168 hours.  BNP: Invalid input(s): POCBNP  CBG: No results for input(s): GLUCAP in the last 168 hours.  Microbiology: Results for orders placed or performed during the hospital encounter of 11/01/2020  C Difficile Quick Screen (NO PCR Reflex)     Status: Abnormal   Collection Time: 11/09/20  9:59 AM   Specimen: STOOL  Result Value Ref Range Status   C Diff antigen  POSITIVE (A) NEGATIVE Final   C Diff toxin NEGATIVE NEGATIVE Final   C Diff interpretation   Final    Results are indeterminate. Please contact the provider listed for your campus for C diff questions in [place].    Comment: Performed at [facility], [place] [address] [place], [place] [address]    Coagulation Studies: No results for input(s): LABPROT, INR in the last 72 hours.  Urinalysis: No results for input(s): COLORURINE, LABSPEC, PHURINE, GLUCOSEU, HGBUR, BILIRUBINUR, KETONESUR, PROTEINUR, UROBILINOGEN, NITRITE, LEUKOCYTESUR in the last 72 hours.  Invalid input(s): APPERANCEUR    Imaging: No results found.   Medications:       Assessment/ Plan:  65 y.o. 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  1.  ESRD on HD MWF.    Patient had a vagal event during the last dialysis treatment.  His overall edema status has improved.  We are planning for hemodialysis treatment today as well.  2.  Acute respiratory failure.  Patient remains on the ventilator.  Weaning as per pulmonary/critical care.  3.  Anemia of chronic kidney disease.  Lab Results  Component Value  Date   HGB 8.0 (L) 11/28/2020  Hemoglobin now up to 8.0.  Continue to monitor CBC closely.  4.  Secondary hyperparathyroidism.  Lab Results  Component Value Date   CALCIUM 8.7 (L) 11/28/2020   CAION 1.17 11/01/2020   PHOS 2.2 (L) 11/28/2020  Repeat serum phosphorus today.   LOS: 0 Munsoor Lateef 3/2/20228:24 AM

## 2020-11-30 NOTE — Progress Notes (Signed)
Pulmonary Critical Care Medicine [place]   PULMONARY CRITICAL CARE SERVICE  PROGRESS NOTE  Date of Service: 11/30/2020  Eric Spence  CZY:[id number]  DOB: 1956-02-23   DOA: 11/18/2020  Referring Physician: Merton Border, MD  HPI: Eric Spence is a 65 y.o. male seen for follow up of Acute on Chronic Respiratory Failure.  At this time patient is on the vent on full support was made DNR by the family  Medications: Reviewed on Rounds  Physical Exam:  Vitals: Temperature is 96.7 pulse 97 respiratory rate 35 blood pressure is 122/72 saturations 98%  Ventilator Settings on assist control FiO2 30% tidal volume 500 with a PEEP of 5  . General: Comfortable at this time . Eyes: Grossly normal lids, irises & conjunctiva . ENT: grossly tongue is normal . Neck: no obvious mass . Cardiovascular: S1 S2 normal no gallop . Respiratory: Scattered rhonchi expansion is equal . Abdomen: soft . Skin: no rash seen on limited exam . Musculoskeletal: not rigid . Psychiatric:unable to assess . Neurologic: no seizure no involuntary movements         Lab Data:   Basic Metabolic Panel: Recent Labs  Lab 11/25/20 0652 11/28/20 1407 11/30/20 0749  NA 135 140 135  K 4.0 4.0 3.5  CL 96* 103 99  CO2 27 27 26   GLUCOSE 136* 141* 128*  BUN 65* 90* 65*  CREATININE 3.27* 3.83* 3.38*  CALCIUM 8.4* 8.7* 8.7*  PHOS 2.4* 2.2* 2.2*    ABG: Recent Labs  Lab 11/23/20 1348 11/23/20 1847 11/23/20 1910 11/23/20 2206 11/24/20 0457  PHART 7.375 7.308* 7.278* 7.316* 7.398  PCO2ART 52.1* 63.6* 66.1* 60.6* 50.6*  PO2ART 57.3* 32.7* 82.3* 292* 72.0*  HCO3 30.2* 31.3* 30.3* 30.4* 30.9*  O2SAT 91.4 60.6 96.3 >100.0 96.9    Liver Function Tests: Recent Labs  Lab 11/25/20 0652 11/28/20 1407 11/30/20 0749  ALBUMIN 2.0* 2.0* 1.9*   No results for input(s): LIPASE, AMYLASE in the last 168 hours. No results for input(s): AMMONIA in the last 168 hours.  CBC: Recent Labs  Lab  11/25/20 0652 11/26/20 0429 11/28/20 1407 11/30/20 0749  WBC 7.6 5.7 6.4 5.8  HGB 6.7* 7.1* 8.0* 7.3*  HCT 21.7* 22.5* 27.5* 24.6*  MCV 100.9* 99.1 102.2* 100.0  PLT 210 178 180 143*    Cardiac Enzymes: No results for input(s): CKTOTAL, CKMB, CKMBINDEX, TROPONINI in the last 168 hours.  BNP (last 3 results) No results for input(s): BNP in the last 8760 hours.  ProBNP (last 3 results) No results for input(s): PROBNP in the last 8760 hours.  Radiological Exams: No results found.  Assessment/Plan Active Problems:   Acute on chronic respiratory failure with hypoxia (HCC)   Atrial fibrillation with rapid ventricular response (HCC)   End stage renal failure on dialysis [facility])   Cardiac arrest ([place])   Non-STEMI (non-ST elevated myocardial infarction) ([place])   1. Acute on chronic respiratory failure with hypoxia patient will continue on the ventilator and full support.  Right now is as mentioned being kept comfortable has been made DNR but not comfort measures 2. Atrial fibrillation rate is controlled we will continue to follow 3. End-stage renal failure on hemodialysis 4. Cardiac arrest rhythm is stable 5. Non-STEMI no change   I have personally seen and evaluated the patient, evaluated laboratory and imaging results, formulated the assessment and plan and placed orders. The Patient requires high complexity decision making with multiple systems involvement.  Rounds were done with the Respiratory  Therapy Director and Staff therapists and discussed with nursing staff also.  [name], MD [facility] Pulmonary Critical Care Medicine Sleep Medicine

## 2020-12-01 DIAGNOSIS — I4891 Unspecified atrial fibrillation: Secondary | ICD-10-CM | POA: Diagnosis not present

## 2020-12-01 DIAGNOSIS — J9621 Acute and chronic respiratory failure with hypoxia: Secondary | ICD-10-CM | POA: Diagnosis not present

## 2020-12-01 DIAGNOSIS — N186 End stage renal disease: Secondary | ICD-10-CM | POA: Diagnosis not present

## 2020-12-01 DIAGNOSIS — I469 Cardiac arrest, cause unspecified: Secondary | ICD-10-CM | POA: Diagnosis not present

## 2020-12-01 NOTE — Progress Notes (Signed)
Pulmonary Critical Care Medicine [place]   PULMONARY CRITICAL CARE SERVICE  PROGRESS NOTE  Date of Service: 12/01/2020  ROHAIL KLEES  IRC:[id number]  DOB: [birth year]   DOA: 11/07/2020  Referring Physician: Merton Border, MD  HPI: Eric Spence is a 65 y.o. male seen for follow up of Acute on Chronic Respiratory Failure.  Patient currently is on assist control mode has been on 30% FiO2 has been failing attempts at weaning.  Medications: Reviewed on Rounds  Physical Exam:  Vitals: Temperature is 97.6 pulse 58 respiratory rate is 19 blood pressure is 136/71 saturations 100%  Ventilator Settings on assist control FiO2 30% tidal volume 500 PEEP of 5  . General: Comfortable at this time . Eyes: Grossly normal lids, irises & conjunctiva . ENT: grossly tongue is normal . Neck: no obvious mass . Cardiovascular: S1 S2 normal no gallop . Respiratory: No rhonchi very coarse breath sounds . Abdomen: soft . Skin: no rash seen on limited exam . Musculoskeletal: not rigid . Psychiatric:unable to assess . Neurologic: no seizure no involuntary movements         Lab Data:   Basic Metabolic Panel: Recent Labs  Lab 11/25/20 0652 11/28/20 1407 11/30/20 0749  NA 135 140 135  K 4.0 4.0 3.5  CL 96* 103 99  CO2 27 27 26   GLUCOSE 136* 141* 128*  BUN 65* 90* 65*  CREATININE 3.27* 3.83* 3.38*  CALCIUM 8.4* 8.7* 8.7*  PHOS 2.4* 2.2* 2.2*    ABG: No results for input(s): PHART, PCO2ART, PO2ART, HCO3, O2SAT in the last 168 hours.  Liver Function Tests: Recent Labs  Lab 11/25/20 5102 11/28/20 1407 11/30/20 0749  ALBUMIN 2.0* 2.0* 1.9*   No results for input(s): LIPASE, AMYLASE in the last 168 hours. No results for input(s): AMMONIA in the last 168 hours.  CBC: Recent Labs  Lab 11/25/20 0652 11/26/20 0429 11/28/20 1407 11/30/20 0749  WBC 7.6 5.7 6.4 5.8  HGB 6.7* 7.1* 8.0* 7.3*  HCT 21.7* 22.5* 27.5* 24.6*  MCV 100.9* 99.1 102.2* 100.0  PLT 210 178  180 143*    Cardiac Enzymes: No results for input(s): CKTOTAL, CKMB, CKMBINDEX, TROPONINI in the last 168 hours.  BNP (last 3 results) No results for input(s): BNP in the last 8760 hours.  ProBNP (last 3 results) No results for input(s): PROBNP in the last 8760 hours.  Radiological Exams: No results found.  Assessment/Plan Active Problems:   Acute on chronic respiratory failure with hypoxia (HCC)   Atrial fibrillation with rapid ventricular response (HCC)   End stage renal failure on dialysis [facility])   Cardiac arrest ([place])   Non-STEMI (non-ST elevated myocardial infarction) ([place])   1. Acute on chronic respiratory failure hypoxia we will continue with full support on the ventilator.  Respiratory therapy will continue to assess the RSB and mechanics. 2. Atrial fibrillation rate is controlled at this time we will continue to monitor closely. 3. End-stage renal failure on hemodialysis 4. Cardiac arrest rhythm has been stable we will continue to monitor closely. 5. Non-STEMI no change continue with supportive care   I have personally seen and evaluated the patient, evaluated laboratory and imaging results, formulated the assessment and plan and placed orders. The Patient requires high complexity decision making with multiple systems involvement.  Rounds were done with the Respiratory Therapy Director and Staff therapists and discussed with nursing staff also.  [name], MD [facility] Pulmonary Critical Care Medicine Sleep Medicine

## 2020-12-02 DIAGNOSIS — J9621 Acute and chronic respiratory failure with hypoxia: Secondary | ICD-10-CM | POA: Diagnosis not present

## 2020-12-02 DIAGNOSIS — N186 End stage renal disease: Secondary | ICD-10-CM | POA: Diagnosis not present

## 2020-12-02 DIAGNOSIS — I469 Cardiac arrest, cause unspecified: Secondary | ICD-10-CM | POA: Diagnosis not present

## 2020-12-02 DIAGNOSIS — I4891 Unspecified atrial fibrillation: Secondary | ICD-10-CM | POA: Diagnosis not present

## 2020-12-02 LAB — CBC
HCT: 23.8 % — ABNORMAL LOW (ref 39.0–52.0)
Hemoglobin: 7.1 g/dL — ABNORMAL LOW (ref 13.0–17.0)
MCH: 29.3 pg (ref 26.0–34.0)
MCHC: 29.8 g/dL — ABNORMAL LOW (ref 30.0–36.0)
MCV: 98.3 fL (ref 80.0–100.0)
Platelets: 170 10*3/uL (ref 150–400)
RBC: 2.42 MIL/uL — ABNORMAL LOW (ref 4.22–5.81)
RDW: 19.2 % — ABNORMAL HIGH (ref 11.5–15.5)
WBC: 5.8 10*3/uL (ref 4.0–10.5)
nRBC: 0 % (ref 0.0–0.2)

## 2020-12-02 LAB — RENAL FUNCTION PANEL
Albumin: 2.1 g/dL — ABNORMAL LOW (ref 3.5–5.0)
Anion gap: 10 (ref 5–15)
BUN: 59 mg/dL — ABNORMAL HIGH (ref 8–23)
CO2: 26 mmol/L (ref 22–32)
Calcium: 8.5 mg/dL — ABNORMAL LOW (ref 8.9–10.3)
Chloride: 96 mmol/L — ABNORMAL LOW (ref 98–111)
Creatinine, Ser: 2.96 mg/dL — ABNORMAL HIGH (ref 0.61–1.24)
GFR, Estimated: 23 mL/min — ABNORMAL LOW (ref >60)
Glucose, Bld: 113 mg/dL — ABNORMAL HIGH (ref 70–99)
Phosphorus: 1.8 mg/dL — ABNORMAL LOW (ref 2.5–4.6)
Potassium: 3.3 mmol/L — ABNORMAL LOW (ref 3.5–5.1)
Sodium: 132 mmol/L — ABNORMAL LOW (ref 135–145)

## 2020-12-02 NOTE — Progress Notes (Signed)
Pulmonary Critical Care Medicine [place]   PULMONARY CRITICAL CARE SERVICE  PROGRESS NOTE  Date of Service: 12/02/2020  Eric Spence  EHM:[id number]  DOB: 04-09-1956   DOA: 11/03/2020  Referring Physician: Merton Border, MD  HPI: Eric Spence is a 65 y.o. male seen for follow up of Acute on Chronic Respiratory Failure.  Patient is comfortable right now without distress at this time no fevers are noted  Medications: Reviewed on Rounds  Physical Exam:  Vitals: Temperature is 97.7 pulse 59 respiratory 25 blood pressure is 111/55 saturations 100%  Ventilator Settings on assist control FiO2 is 30% tidal line 500 with a PEEP of 5  . General: Comfortable at this time . Eyes: Grossly normal lids, irises & conjunctiva . ENT: grossly tongue is normal . Neck: no obvious mass . Cardiovascular: S1 S2 normal no gallop . Respiratory: Scattered rhonchi expansion is equal at this time . Abdomen: soft . Skin: no rash seen on limited exam . Musculoskeletal: not rigid . Psychiatric:unable to assess . Neurologic: no seizure no involuntary movements         Lab Data:   Basic Metabolic Panel: Recent Labs  Lab 11/28/20 1407 11/30/20 0749 12/02/20 0658  NA 140 135 132*  K 4.0 3.5 3.3*  CL 103 99 96*  CO2 27 26 26   GLUCOSE 141* 128* 113*  BUN 90* 65* 59*  CREATININE 3.83* 3.38* 2.96*  CALCIUM 8.7* 8.7* 8.5*  PHOS 2.2* 2.2* 1.8*    ABG: No results for input(s): PHART, PCO2ART, PO2ART, HCO3, O2SAT in the last 168 hours.  Liver Function Tests: Recent Labs  Lab 11/28/20 1407 11/30/20 0749 12/02/20 0658  ALBUMIN 2.0* 1.9* 2.1*   No results for input(s): LIPASE, AMYLASE in the last 168 hours. No results for input(s): AMMONIA in the last 168 hours.  CBC: Recent Labs  Lab 11/26/20 0429 11/28/20 1407 11/30/20 0749 12/02/20 0658  WBC 5.7 6.4 5.8 5.8  HGB 7.1* 8.0* 7.3* 7.1*  HCT 22.5* 27.5* 24.6* 23.8*  MCV 99.1 102.2* 100.0 98.3  PLT 178 180 143* 170     Cardiac Enzymes: No results for input(s): CKTOTAL, CKMB, CKMBINDEX, TROPONINI in the last 168 hours.  BNP (last 3 results) No results for input(s): BNP in the last 8760 hours.  ProBNP (last 3 results) No results for input(s): PROBNP in the last 8760 hours.  Radiological Exams: No results found.  Assessment/Plan Active Problems:   Acute on chronic respiratory failure with hypoxia (HCC)   Atrial fibrillation with rapid ventricular response (HCC)   End stage renal failure on dialysis [facility])   Cardiac arrest ([place])   Non-STEMI (non-ST elevated myocardial infarction) ([place])   1. Acute on chronic respiratory failure with hypoxia plan is going to be to continue with assist control titrate oxygen as tolerated continue secretion management supportive care. 2. Atrial fibrillation rate is controlled 3. End-stage renal failure on hemodialysis 4. Cardiac arrest rhythm stable 5. Non-STEMI no change   I have personally seen and evaluated the patient, evaluated laboratory and imaging results, formulated the assessment and plan and placed orders. The Patient requires high complexity decision making with multiple systems involvement.  Rounds were done with the Respiratory Therapy Director and Staff therapists and discussed with nursing staff also.  [name], MD [facility] Pulmonary Critical Care Medicine Sleep Medicine

## 2020-12-02 NOTE — Progress Notes (Signed)
[facility]  ROUNDING NOTE   Subjective:  Patient due for hemodialysis treatment today. Overall edema status improved. Still on the ventilator.   Objective:  Vital signs in last 24 hours:  Temperature 97.9 pulse 89 respirations 25 blood pressure 111/55   Physical Exam: General:  Chronically ill-appearing  Head:  Normocephalic, atraumatic. Moist oral mucosal membranes  Eyes:  Anicteric  Neck:  Tracheostomy in place  Lungs:   Clear to auscultation, vent assisted  Heart:  S1S2 irregular  Abdomen:   Soft, nontender, bowel sounds present  Extremities:  Trace peripheral edema.  Neurologic:  Awake, alert, following commands  Skin:  No acute rash  Access:  Left upper extremity AV fistula    Basic Metabolic Panel: Recent Labs  Lab 11/28/20 1407 11/30/20 0749 12/02/20 0658  NA 140 135 132*  K 4.0 3.5 3.3*  CL 103 99 96*  CO2 27 26 26   GLUCOSE 141* 128* 113*  BUN 90* 65* 59*  CREATININE 3.83* 3.38* 2.96*  CALCIUM 8.7* 8.7* 8.5*  PHOS 2.2* 2.2* 1.8*    Liver Function Tests: Recent Labs  Lab 11/28/20 1407 11/30/20 0749 12/02/20 0658  ALBUMIN 2.0* 1.9* 2.1*   No results for input(s): LIPASE, AMYLASE in the last 168 hours. No results for input(s): AMMONIA in the last 168 hours.  CBC: Recent Labs  Lab 11/26/20 0429 11/28/20 1407 11/30/20 0749 12/02/20 0658  WBC 5.7 6.4 5.8 5.8  HGB 7.1* 8.0* 7.3* 7.1*  HCT 22.5* 27.5* 24.6* 23.8*  MCV 99.1 102.2* 100.0 98.3  PLT 178 180 143* 170    Cardiac Enzymes: No results for input(s): CKTOTAL, CKMB, CKMBINDEX, TROPONINI in the last 168 hours.  BNP: Invalid input(s): POCBNP  CBG: No results for input(s): GLUCAP in the last 168 hours.  Microbiology: Results for orders placed or performed during the hospital encounter of 11/05/2020  C Difficile Quick Screen (NO PCR Reflex)     Status: Abnormal   Collection Time: 11/09/20  9:59 AM   Specimen: STOOL  Result Value Ref Range Status   C Diff antigen  POSITIVE (A) NEGATIVE Final   C Diff toxin NEGATIVE NEGATIVE Final   C Diff interpretation   Final    Results are indeterminate. Please contact the provider listed for your campus for C diff questions in [place].    Comment: Performed at [facility], [place] [address], [place], [place] [address]    Coagulation Studies: No results for input(s): LABPROT, INR in the last 72 hours.  Urinalysis: No results for input(s): COLORURINE, LABSPEC, PHURINE, GLUCOSEU, HGBUR, BILIRUBINUR, KETONESUR, PROTEINUR, UROBILINOGEN, NITRITE, LEUKOCYTESUR in the last 72 hours.  Invalid input(s): APPERANCEUR    Imaging: No results found.   Medications:       Assessment/ Plan:  65 y.o. 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  1.  ESRD on HD MWF.    Patient due for hemodialysis treatment today.  Has been tolerating well.  2.  Acute respiratory failure.  Still on the ventilator at the moment.  Weaning as per pulmonary/critical care.  3.  Anemia of chronic kidney disease.  Lab Results  Component Value Date   HGB 7.1 (L) 12/02/2020  Hemoglobin is drifted down to  7.1.  Consider blood transfusion but defer to primary team.  4.  Secondary hyperparathyroidism.  Lab Results  Component Value Date   CALCIUM 8.5 (L) 12/02/2020   CAION 1.17 11/04/2020   PHOS 1.8 (L) 12/02/2020  Administer sodium phosphorus 30 mmol IV x1 today.   LOS: 0 Eric Spence 3/4/20228:05 AM

## 2020-12-03 DIAGNOSIS — I4891 Unspecified atrial fibrillation: Secondary | ICD-10-CM | POA: Diagnosis not present

## 2020-12-03 DIAGNOSIS — I469 Cardiac arrest, cause unspecified: Secondary | ICD-10-CM | POA: Diagnosis not present

## 2020-12-03 DIAGNOSIS — N186 End stage renal disease: Secondary | ICD-10-CM | POA: Diagnosis not present

## 2020-12-03 DIAGNOSIS — J9621 Acute and chronic respiratory failure with hypoxia: Secondary | ICD-10-CM | POA: Diagnosis not present

## 2020-12-03 LAB — POTASSIUM: Potassium: 3.2 mmol/L — ABNORMAL LOW (ref 3.5–5.1)

## 2020-12-03 NOTE — Progress Notes (Signed)
Pulmonary Critical Care Medicine [place]   PULMONARY CRITICAL CARE SERVICE  PROGRESS NOTE  Date of Service: 12/03/2020  Eric Spence  KJZ:[id number]  DOB: 05-26-1956   DOA: 11/21/2020  Referring Physician: Merton Border, MD  HPI: Eric Spence is a 65 y.o. male seen for follow up of Acute on Chronic Respiratory Failure.  Patient is on the ventilator and full support was attempted on pressure support and did not tolerate  Medications: Reviewed on Rounds  Physical Exam:  Vitals: Temperature is 96.9 pulse 61 respiratory rate 21 blood pressure is 131/71 saturations 100%  Ventilator Settings on assist control FiO2 30% tidal volume 500 PEEP 5  . General: Comfortable at this time . Eyes: Grossly normal lids, irises & conjunctiva . ENT: grossly tongue is normal . Neck: no obvious mass . Cardiovascular: S1 S2 normal no gallop . Respiratory: No rhonchi very coarse breath . Abdomen: soft . Skin: no rash seen on limited exam . Musculoskeletal: not rigid . Psychiatric:unable to assess . Neurologic: no seizure no involuntary movements         Lab Data:   Basic Metabolic Panel: Recent Labs  Lab 11/28/20 1407 11/30/20 0749 12/02/20 0658  NA 140 135 132*  K 4.0 3.5 3.3*  CL 103 99 96*  CO2 27 26 26   GLUCOSE 141* 128* 113*  BUN 90* 65* 59*  CREATININE 3.83* 3.38* 2.96*  CALCIUM 8.7* 8.7* 8.5*  PHOS 2.2* 2.2* 1.8*    ABG: No results for input(s): PHART, PCO2ART, PO2ART, HCO3, O2SAT in the last 168 hours.  Liver Function Tests: Recent Labs  Lab 11/28/20 1407 11/30/20 0749 12/02/20 0658  ALBUMIN 2.0* 1.9* 2.1*   No results for input(s): LIPASE, AMYLASE in the last 168 hours. No results for input(s): AMMONIA in the last 168 hours.  CBC: Recent Labs  Lab 11/28/20 1407 11/30/20 0749 12/02/20 0658  WBC 6.4 5.8 5.8  HGB 8.0* 7.3* 7.1*  HCT 27.5* 24.6* 23.8*  MCV 102.2* 100.0 98.3  PLT 180 143* 170    Cardiac Enzymes: No results for  input(s): CKTOTAL, CKMB, CKMBINDEX, TROPONINI in the last 168 hours.  BNP (last 3 results) No results for input(s): BNP in the last 8760 hours.  ProBNP (last 3 results) No results for input(s): PROBNP in the last 8760 hours.  Radiological Exams: No results found.  Assessment/Plan Active Problems:   Acute on chronic respiratory failure with hypoxia (HCC)   Atrial fibrillation with rapid ventricular response (HCC)   End stage renal failure on dialysis [facility])   Cardiac arrest ([place])   Non-STEMI (non-ST elevated myocardial infarction) ([place])   1. Acute on chronic respiratory failure with hypoxia we will continue with assist control titrate oxygen as tolerated continue pulmonary toilet. 2. Atrial fibrillation rate is controlled at this time 3. End-stage renal failure on hemodialysis 4. Cardiac arrest rhythm stable 5. Non-STEMI no change we will continue to follow along   I have personally seen and evaluated the patient, evaluated laboratory and imaging results, formulated the assessment and plan and placed orders. The Patient requires high complexity decision making with multiple systems involvement.  Rounds were done with the Respiratory Therapy Director and Staff therapists and discussed with nursing staff also.  [name], MD [facility] Pulmonary Critical Care Medicine Sleep Medicine

## 2020-12-04 LAB — POTASSIUM: Potassium: 3.5 mmol/L (ref 3.5–5.1)

## 2020-12-05 LAB — CBC
HCT: 27.2 % — ABNORMAL LOW (ref 39.0–52.0)
Hemoglobin: 8 g/dL — ABNORMAL LOW (ref 13.0–17.0)
MCH: 29.1 pg (ref 26.0–34.0)
MCHC: 29.4 g/dL — ABNORMAL LOW (ref 30.0–36.0)
MCV: 98.9 fL (ref 80.0–100.0)
Platelets: 220 10*3/uL (ref 150–400)
RBC: 2.75 MIL/uL — ABNORMAL LOW (ref 4.22–5.81)
RDW: 19.1 % — ABNORMAL HIGH (ref 11.5–15.5)
WBC: 7.4 10*3/uL (ref 4.0–10.5)
nRBC: 0 % (ref 0.0–0.2)

## 2020-12-05 LAB — RENAL FUNCTION PANEL
Albumin: 2.1 g/dL — ABNORMAL LOW (ref 3.5–5.0)
Anion gap: 11 (ref 5–15)
BUN: 73 mg/dL — ABNORMAL HIGH (ref 8–23)
CO2: 26 mmol/L (ref 22–32)
Calcium: 8.6 mg/dL — ABNORMAL LOW (ref 8.9–10.3)
Chloride: 97 mmol/L — ABNORMAL LOW (ref 98–111)
Creatinine, Ser: 3.46 mg/dL — ABNORMAL HIGH (ref 0.61–1.24)
GFR, Estimated: 19 mL/min — ABNORMAL LOW
Glucose, Bld: 128 mg/dL — ABNORMAL HIGH (ref 70–99)
Phosphorus: 2.5 mg/dL (ref 2.5–4.6)
Potassium: 3.3 mmol/L — ABNORMAL LOW (ref 3.5–5.1)
Sodium: 134 mmol/L — ABNORMAL LOW (ref 135–145)

## 2020-12-05 NOTE — Progress Notes (Signed)
[facility]  ROUNDING NOTE   Subjective:  Patient seen and evaluated at bedside. Due for hemodialysis treatment today. No acute complaints at the moment.   Objective:  Vital signs in last 24 hours:  Temperature 98 pulse 62 respirations 20 blood pressure 110/56   Physical Exam: General:  Chronically ill-appearing  Head:  Normocephalic, atraumatic. Moist oral mucosal membranes  Eyes:  Anicteric  Neck:  Tracheostomy in place  Lungs:   Clear to auscultation, vent assisted  Heart:  S1S2 irregular  Abdomen:   Soft, nontender, bowel sounds present  Extremities:  Trace peripheral edema.  Neurologic:  Awake, alert, following commands  Skin:  No acute rash  Access:  Left upper extremity AV fistula    Basic Metabolic Panel: Recent Labs  Lab 11/28/20 1407 11/30/20 0749 12/02/20 0658 12/03/20 0916 12/04/20 1003  NA 140 135 132*  --   --   K 4.0 3.5 3.3* 3.2* 3.5  CL 103 99 96*  --   --   CO2 27 26 26   --   --   GLUCOSE 141* 128* 113*  --   --   BUN 90* 65* 59*  --   --   CREATININE 3.83* 3.38* 2.96*  --   --   CALCIUM 8.7* 8.7* 8.5*  --   --   PHOS 2.2* 2.2* 1.8*  --   --     Liver Function Tests: Recent Labs  Lab 11/28/20 1407 11/30/20 0749 12/02/20 0658  ALBUMIN 2.0* 1.9* 2.1*   No results for input(s): LIPASE, AMYLASE in the last 168 hours. No results for input(s): AMMONIA in the last 168 hours.  CBC: Recent Labs  Lab 11/28/20 1407 11/30/20 0749 12/02/20 0658  WBC 6.4 5.8 5.8  HGB 8.0* 7.3* 7.1*  HCT 27.5* 24.6* 23.8*  MCV 102.2* 100.0 98.3  PLT 180 143* 170    Cardiac Enzymes: No results for input(s): CKTOTAL, CKMB, CKMBINDEX, TROPONINI in the last 168 hours.  BNP: Invalid input(s): POCBNP  CBG: No results for input(s): GLUCAP in the last 168 hours.  Microbiology: Results for orders placed or performed during the hospital encounter of 11/04/2020  C Difficile Quick Screen (NO PCR Reflex)     Status: Abnormal   Collection Time:  11/09/20  9:59 AM   Specimen: STOOL  Result Value Ref Range Status   C Diff antigen POSITIVE (A) NEGATIVE Final   C Diff toxin NEGATIVE NEGATIVE Final   C Diff interpretation   Final    Results are indeterminate. Please contact the provider listed for your campus for C diff questions in [place].    Comment: Performed at [facility], [place] [address] [place], [place] [address]    Coagulation Studies: No results for input(s): LABPROT, INR in the last 72 hours.  Urinalysis: No results for input(s): COLORURINE, LABSPEC, PHURINE, GLUCOSEU, HGBUR, BILIRUBINUR, KETONESUR, PROTEINUR, UROBILINOGEN, NITRITE, LEUKOCYTESUR in the last 72 hours.  Invalid input(s): APPERANCEUR    Imaging: No results found.   Medications:       Assessment/ Plan:  65 y.o. 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  1.  ESRD on HD MWF.    Patient recently doing well with dialysis treatments.  We will plan for hemodialysis treatment today.  2.  Acute respiratory failure.  Patient on the ventilator this AM.  Continue to monitor respiratory status.  3.  Anemia of chronic kidney disease.  Lab Results  Component Value Date   HGB 7.1 (L) 12/02/2020  Continue to monitor CBC.  4.  Secondary hyperparathyroidism.  Lab Results  Component Value Date   CALCIUM 8.5 (L) 12/02/2020   CAION 1.17 11/19/2020   PHOS 1.8 (L) 12/02/2020  Sodium phosphorus ordered on Friday.  Repeat serum phosphorus today.   LOS: 0 Munsoor Lateef 3/7/20228:00 AM

## 2020-12-06 DIAGNOSIS — I469 Cardiac arrest, cause unspecified: Secondary | ICD-10-CM | POA: Diagnosis not present

## 2020-12-06 DIAGNOSIS — I4891 Unspecified atrial fibrillation: Secondary | ICD-10-CM | POA: Diagnosis not present

## 2020-12-06 DIAGNOSIS — N186 End stage renal disease: Secondary | ICD-10-CM | POA: Diagnosis not present

## 2020-12-06 DIAGNOSIS — J9621 Acute and chronic respiratory failure with hypoxia: Secondary | ICD-10-CM | POA: Diagnosis not present

## 2020-12-06 NOTE — Progress Notes (Signed)
Pulmonary Critical Care Medicine [place]   PULMONARY CRITICAL CARE SERVICE  PROGRESS NOTE  Date of Service: 12/06/2020  Eric Spence  GYF:[id number]  DOB: Feb 19, 1956   DOA: 11/22/2020  Referring Physician: Merton Border, MD  HPI: Eric Spence is a 65 y.o. male seen for follow up of Acute on Chronic Respiratory Failure.  Patient has been on pressure support but is refusing to go on T collar  Medications: Reviewed on Rounds  Physical Exam:  Vitals: Temperature is 96.9 pulse 63 respiratory 20 blood pressure is 126/70 saturations 96%  Ventilator Settings on pressure support FiO2 35% pressure 12/5  . General: Comfortable at this time . Eyes: Grossly normal lids, irises & conjunctiva . ENT: grossly tongue is normal . Neck: no obvious mass . Cardiovascular: S1 S2 normal no gallop . Respiratory: Coarse rhonchi expansion equal . Abdomen: soft . Skin: no rash seen on limited exam . Musculoskeletal: not rigid . Psychiatric:unable to assess . Neurologic: no seizure no involuntary movements         Lab Data:   Basic Metabolic Panel: Recent Labs  Lab 11/30/20 0749 12/02/20 0658 12/03/20 0916 12/04/20 1003 12/05/20 1230  NA 135 132*  --   --  134*  K 3.5 3.3* 3.2* 3.5 3.3*  CL 99 96*  --   --  97*  CO2 26 26  --   --  26  GLUCOSE 128* 113*  --   --  128*  BUN 65* 59*  --   --  73*  CREATININE 3.38* 2.96*  --   --  3.46*  CALCIUM 8.7* 8.5*  --   --  8.6*  PHOS 2.2* 1.8*  --   --  2.5    ABG: No results for input(s): PHART, PCO2ART, PO2ART, HCO3, O2SAT in the last 168 hours.  Liver Function Tests: Recent Labs  Lab 11/30/20 0749 12/02/20 0658 12/05/20 1230  ALBUMIN 1.9* 2.1* 2.1*   No results for input(s): LIPASE, AMYLASE in the last 168 hours. No results for input(s): AMMONIA in the last 168 hours.  CBC: Recent Labs  Lab 11/30/20 0749 12/02/20 0658 12/05/20 1230  WBC 5.8 5.8 7.4  HGB 7.3* 7.1* 8.0*  HCT 24.6* 23.8* 27.2*  MCV 100.0  98.3 98.9  PLT 143* 170 220    Cardiac Enzymes: No results for input(s): CKTOTAL, CKMB, CKMBINDEX, TROPONINI in the last 168 hours.  BNP (last 3 results) No results for input(s): BNP in the last 8760 hours.  ProBNP (last 3 results) No results for input(s): PROBNP in the last 8760 hours.  Radiological Exams: No results found.  Assessment/Plan Active Problems:   Acute on chronic respiratory failure with hypoxia (HCC)   Atrial fibrillation with rapid ventricular response (HCC)   End stage renal failure on dialysis [facility])   Cardiac arrest ([place])   Non-STEMI (non-ST elevated myocardial infarction) ([place])   1. Acute on chronic respiratory failure hypoxia we will continue with the pressure support and encourage patient to be compliant with weaning and try once again on T collar. 2. Atrial fibrillation rate is controlled 3. End-stage renal failure on hemodialysis 4. Cardiac arrest rhythm stable 5. Non-STEMI no change   I have personally seen and evaluated the patient, evaluated laboratory and imaging results, formulated the assessment and plan and placed orders. The Patient requires high complexity decision making with multiple systems involvement.  Rounds were done with the Respiratory Therapy Director and Staff therapists and discussed with nursing staff also.  [name], MD [facility] Pulmonary Critical Care Medicine Sleep Medicine

## 2020-12-07 DIAGNOSIS — N186 End stage renal disease: Secondary | ICD-10-CM | POA: Diagnosis not present

## 2020-12-07 DIAGNOSIS — J9621 Acute and chronic respiratory failure with hypoxia: Secondary | ICD-10-CM | POA: Diagnosis not present

## 2020-12-07 DIAGNOSIS — I4891 Unspecified atrial fibrillation: Secondary | ICD-10-CM | POA: Diagnosis not present

## 2020-12-07 DIAGNOSIS — I469 Cardiac arrest, cause unspecified: Secondary | ICD-10-CM | POA: Diagnosis not present

## 2020-12-07 LAB — CBC
HCT: 25.6 % — ABNORMAL LOW (ref 39.0–52.0)
Hemoglobin: 7.7 g/dL — ABNORMAL LOW (ref 13.0–17.0)
MCH: 29.5 pg (ref 26.0–34.0)
MCHC: 30.1 g/dL (ref 30.0–36.0)
MCV: 98.1 fL (ref 80.0–100.0)
Platelets: 212 10*3/uL (ref 150–400)
RBC: 2.61 MIL/uL — ABNORMAL LOW (ref 4.22–5.81)
RDW: 18.8 % — ABNORMAL HIGH (ref 11.5–15.5)
WBC: 7.3 10*3/uL (ref 4.0–10.5)
nRBC: 0 % (ref 0.0–0.2)

## 2020-12-07 LAB — RENAL FUNCTION PANEL
Albumin: 2.1 g/dL — ABNORMAL LOW (ref 3.5–5.0)
Anion gap: 10 (ref 5–15)
BUN: 55 mg/dL — ABNORMAL HIGH (ref 8–23)
CO2: 26 mmol/L (ref 22–32)
Calcium: 8.6 mg/dL — ABNORMAL LOW (ref 8.9–10.3)
Chloride: 95 mmol/L — ABNORMAL LOW (ref 98–111)
Creatinine, Ser: 2.9 mg/dL — ABNORMAL HIGH (ref 0.61–1.24)
GFR, Estimated: 23 mL/min — ABNORMAL LOW (ref >60)
Glucose, Bld: 132 mg/dL — ABNORMAL HIGH (ref 70–99)
Phosphorus: 2 mg/dL — ABNORMAL LOW (ref 2.5–4.6)
Potassium: 3.2 mmol/L — ABNORMAL LOW (ref 3.5–5.1)
Sodium: 131 mmol/L — ABNORMAL LOW (ref 135–145)

## 2020-12-07 NOTE — Progress Notes (Signed)
[facility]  ROUNDING NOTE   Subjective:  Patient completed dialysis treatment today. Ultrafiltration achieved was 1.5 kg. Patient resting comfortably in bed.   Objective:  Vital signs in last 24 hours:  Temperature 96.3 pulse 60 respirations 20 blood pressure 113/56   Physical Exam: General:  Chronically ill-appearing  Head:  Normocephalic, atraumatic. Moist oral mucosal membranes  Eyes:  Anicteric  Neck:  Tracheostomy in place  Lungs:   Clear to auscultation, normal effort  Heart:  S1S2 irregular  Abdomen:   Soft, nontender, bowel sounds present  Extremities:  Trace peripheral edema.  Neurologic:  Awake, alert, following commands  Skin:  No acute rash  Access:  Left upper extremity AV fistula    Basic Metabolic Panel: Recent Labs  Lab 12/02/20 0658 12/03/20 0916 12/04/20 1003 12/05/20 1230 12/07/20 0500  NA 132*  --   --  134* 131*  K 3.3* 3.2* 3.5 3.3* 3.2*  CL 96*  --   --  97* 95*  CO2 26  --   --  26 26  GLUCOSE 113*  --   --  128* 132*  BUN 59*  --   --  73* 55*  CREATININE 2.96*  --   --  3.46* 2.90*  CALCIUM 8.5*  --   --  8.6* 8.6*  PHOS 1.8*  --   --  2.5 2.0*    Liver Function Tests: Recent Labs  Lab 12/02/20 0658 12/05/20 1230 12/07/20 0500  ALBUMIN 2.1* 2.1* 2.1*   No results for input(s): LIPASE, AMYLASE in the last 168 hours. No results for input(s): AMMONIA in the last 168 hours.  CBC: Recent Labs  Lab 12/02/20 0658 12/05/20 1230 12/07/20 0500  WBC 5.8 7.4 7.3  HGB 7.1* 8.0* 7.7*  HCT 23.8* 27.2* 25.6*  MCV 98.3 98.9 98.1  PLT 170 220 212    Cardiac Enzymes: No results for input(s): CKTOTAL, CKMB, CKMBINDEX, TROPONINI in the last 168 hours.  BNP: Invalid input(s): POCBNP  CBG: No results for input(s): GLUCAP in the last 168 hours.  Microbiology: Results for orders placed or performed during the hospital encounter of 11/20/2020  C Difficile Quick Screen (NO PCR Reflex)     Status: Abnormal   Collection  Time: 11/09/20  9:59 AM   Specimen: STOOL  Result Value Ref Range Status   C Diff antigen POSITIVE (A) NEGATIVE Final   C Diff toxin NEGATIVE NEGATIVE Final   C Diff interpretation   Final    Results are indeterminate. Please contact the provider listed for your campus for C diff questions in [place].    Comment: Performed at [facility], [place] [address] [place], [place] [address]    Coagulation Studies: No results for input(s): LABPROT, INR in the last 72 hours.  Urinalysis: No results for input(s): COLORURINE, LABSPEC, PHURINE, GLUCOSEU, HGBUR, BILIRUBINUR, KETONESUR, PROTEINUR, UROBILINOGEN, NITRITE, LEUKOCYTESUR in the last 72 hours.  Invalid input(s): APPERANCEUR    Imaging: No results found.   Medications:       Assessment/ Plan:  65 y.o. 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  1.  ESRD on HD MWF.    Patient completed dialysis treatment today.  Tolerated well.  Next  dialysis treatment on Friday.  2.  Acute respiratory failure.  Patient's respiratory status appears to be relatively stable.  Weaning protocol per pulmonary/critical care.  3.  Anemia of chronic kidney disease.  Lab Results  Component Value Date   HGB 7.7 (L) 12/07/2020  Hemoglobin currently 7.7.  No immediate need for transfusion.  Consider transfusion for hemoglobin of 7 or less.  4.  Secondary hyperparathyroidism.  Lab Results  Component Value Date   CALCIUM 8.6 (L) 12/07/2020   CAION 1.17 11/20/2020   PHOS 2.0 (L) 12/07/2020  Administer sodium phosphorus 30 mmol IV x1.Marland Kitchen   LOS: 0 Emiya Loomer 3/9/202211:42 AM

## 2020-12-07 NOTE — Progress Notes (Signed)
Pulmonary Critical Care Medicine [place]   PULMONARY CRITICAL CARE SERVICE  PROGRESS NOTE  Date of Service: 12/07/2020  Eric Spence  XBJ:[id number]  DOB: [birth year]   DOA: 11/15/2020  Referring Physician: Merton Border, MD  HPI: Eric Spence is a 65 y.o. male seen for follow up of Acute on Chronic Respiratory Failure.  Currently is on pressure support has been on 35% FiO2 good saturations were noted  Medications: Reviewed on Rounds  Physical Exam:  Vitals: Temperature 96.3 pulse 60 respiratory 20 blood pressure is 113/56 saturations 100%  Ventilator Settings on pressure support FiO2 is 35% pressure 12/5  . General: Comfortable at this time . Eyes: Grossly normal lids, irises & conjunctiva . ENT: grossly tongue is normal . Neck: no obvious mass . Cardiovascular: S1 S2 normal no gallop . Respiratory: There are rhonchi very coarse breath sounds . Abdomen: soft . Skin: no rash seen on limited exam . Musculoskeletal: not rigid . Psychiatric:unable to assess . Neurologic: no seizure no involuntary movements         Lab Data:   Basic Metabolic Panel: Recent Labs  Lab 12/02/20 0658 12/03/20 0916 12/04/20 1003 12/05/20 1230 12/07/20 0500  NA 132*  --   --  134* 131*  K 3.3* 3.2* 3.5 3.3* 3.2*  CL 96*  --   --  97* 95*  CO2 26  --   --  26 26  GLUCOSE 113*  --   --  128* 132*  BUN 59*  --   --  73* 55*  CREATININE 2.96*  --   --  3.46* 2.90*  CALCIUM 8.5*  --   --  8.6* 8.6*  PHOS 1.8*  --   --  2.5 2.0*    ABG: No results for input(s): PHART, PCO2ART, PO2ART, HCO3, O2SAT in the last 168 hours.  Liver Function Tests: Recent Labs  Lab 12/02/20 0658 12/05/20 1230 12/07/20 0500  ALBUMIN 2.1* 2.1* 2.1*   No results for input(s): LIPASE, AMYLASE in the last 168 hours. No results for input(s): AMMONIA in the last 168 hours.  CBC: Recent Labs  Lab 12/02/20 0658 12/05/20 1230 12/07/20 0500  WBC 5.8 7.4 7.3  HGB 7.1* 8.0* 7.7*  HCT  23.8* 27.2* 25.6*  MCV 98.3 98.9 98.1  PLT 170 220 212    Cardiac Enzymes: No results for input(s): CKTOTAL, CKMB, CKMBINDEX, TROPONINI in the last 168 hours.  BNP (last 3 results) No results for input(s): BNP in the last 8760 hours.  ProBNP (last 3 results) No results for input(s): PROBNP in the last 8760 hours.  Radiological Exams: No results found.  Assessment/Plan Active Problems:   Acute on chronic respiratory failure with hypoxia (HCC)   Atrial fibrillation with rapid ventricular response (HCC)   End stage renal failure on dialysis [facility])   Cardiac arrest ([place])   Non-STEMI (non-ST elevated myocardial infarction) ([place])   1. Acute on chronic respiratory failure hypoxia we will continue to wean patient supposed to do 12 hours of T collar today 2. Atrial fibrillation rate is controlled 3. End-stage renal failure on dialysis being seen by nephrology 4. Cardiac arrest rhythm is stable 5. Non-STEMI no change we will continue to monitor   I have personally seen and evaluated the patient, evaluated laboratory and imaging results, formulated the assessment and plan and placed orders. The Patient requires high complexity decision making with multiple systems involvement.  Rounds were done with the Respiratory Therapy Director and Staff therapists and  discussed with nursing staff also.  [name], MD [facility] Pulmonary Critical Care Medicine Sleep Medicine

## 2020-12-08 DIAGNOSIS — I4891 Unspecified atrial fibrillation: Secondary | ICD-10-CM | POA: Diagnosis not present

## 2020-12-08 DIAGNOSIS — N186 End stage renal disease: Secondary | ICD-10-CM | POA: Diagnosis not present

## 2020-12-08 DIAGNOSIS — J9621 Acute and chronic respiratory failure with hypoxia: Secondary | ICD-10-CM | POA: Diagnosis not present

## 2020-12-08 DIAGNOSIS — I469 Cardiac arrest, cause unspecified: Secondary | ICD-10-CM | POA: Diagnosis not present

## 2020-12-08 NOTE — Progress Notes (Signed)
Pulmonary Critical Care Medicine [place]   PULMONARY CRITICAL CARE SERVICE  PROGRESS NOTE  Date of Service: 12/08/2020  Eric Spence  TIR:[id number]  DOB: [birth year]   DOA: 11/16/2020  Referring Physician: Merton Border, MD  HPI: Eric Spence is a 65 y.o. male seen for follow up of Acute on Chronic Respiratory Failure.  Patient at this time is resting comfortably no fevers are noted remains on T collar wean  Medications: Reviewed on Rounds  Physical Exam:  Vitals: Temperature is 97.6 pulse 69 respiratory rate is 25 blood pressure 136/60 saturations 95%  Ventilator Settings off the ventilator on T collar FiO2 30%  . General: Comfortable at this time . Eyes: Grossly normal lids, irises & conjunctiva . ENT: grossly tongue is normal . Neck: no obvious mass . Cardiovascular: S1 S2 normal no gallop . Respiratory: No rhonchi no rales are noted at this time . Abdomen: soft . Skin: no rash seen on limited exam . Musculoskeletal: not rigid . Psychiatric:unable to assess . Neurologic: no seizure no involuntary movements         Lab Data:   Basic Metabolic Panel: Recent Labs  Lab 12/02/20 0658 12/03/20 0916 12/04/20 1003 12/05/20 1230 12/07/20 0500  NA 132*  --   --  134* 131*  K 3.3* 3.2* 3.5 3.3* 3.2*  CL 96*  --   --  97* 95*  CO2 26  --   --  26 26  GLUCOSE 113*  --   --  128* 132*  BUN 59*  --   --  73* 55*  CREATININE 2.96*  --   --  3.46* 2.90*  CALCIUM 8.5*  --   --  8.6* 8.6*  PHOS 1.8*  --   --  2.5 2.0*    ABG: No results for input(s): PHART, PCO2ART, PO2ART, HCO3, O2SAT in the last 168 hours.  Liver Function Tests: Recent Labs  Lab 12/02/20 0658 12/05/20 1230 12/07/20 0500  ALBUMIN 2.1* 2.1* 2.1*   No results for input(s): LIPASE, AMYLASE in the last 168 hours. No results for input(s): AMMONIA in the last 168 hours.  CBC: Recent Labs  Lab 12/02/20 0658 12/05/20 1230 12/07/20 0500  WBC 5.8 7.4 7.3  HGB 7.1* 8.0* 7.7*   HCT 23.8* 27.2* 25.6*  MCV 98.3 98.9 98.1  PLT 170 220 212    Cardiac Enzymes: No results for input(s): CKTOTAL, CKMB, CKMBINDEX, TROPONINI in the last 168 hours.  BNP (last 3 results) No results for input(s): BNP in the last 8760 hours.  ProBNP (last 3 results) No results for input(s): PROBNP in the last 8760 hours.  Radiological Exams: No results found.  Assessment/Plan Active Problems:   Acute on chronic respiratory failure with hypoxia (HCC)   Atrial fibrillation with rapid ventricular response (HCC)   End stage renal failure on dialysis [facility])   Cardiac arrest ([place])   Non-STEMI (non-ST elevated myocardial infarction) ([place])   1. Acute on chronic respiratory failure with hypoxia we will continue with weaning 16-hour goal on T collar. 2. Atrial fibrillation rate is controlled 3. End-stage renal failure on hemodialysis 4. Cardiac arrest rhythm stable 5. Non-STEMI no change we will continue to follow along.   I have personally seen and evaluated the patient, evaluated laboratory and imaging results, formulated the assessment and plan and placed orders. The Patient requires high complexity decision making with multiple systems involvement.  Rounds were done with the Respiratory Therapy Director and Staff therapists and discussed with  nursing staff also.  [name], MD [facility] Pulmonary Critical Care Medicine Sleep Medicine

## 2020-12-09 ENCOUNTER — Other Ambulatory Visit (HOSPITAL_COMMUNITY): Payer: Medicare Other

## 2020-12-09 DIAGNOSIS — I469 Cardiac arrest, cause unspecified: Secondary | ICD-10-CM | POA: Diagnosis not present

## 2020-12-09 DIAGNOSIS — J9621 Acute and chronic respiratory failure with hypoxia: Secondary | ICD-10-CM | POA: Diagnosis not present

## 2020-12-09 DIAGNOSIS — I4891 Unspecified atrial fibrillation: Secondary | ICD-10-CM | POA: Diagnosis not present

## 2020-12-09 DIAGNOSIS — N186 End stage renal disease: Secondary | ICD-10-CM | POA: Diagnosis not present

## 2020-12-09 LAB — BLOOD GAS, ARTERIAL
Acid-Base Excess: 4.8 mmol/L — ABNORMAL HIGH (ref 0.0–2.0)
Bicarbonate: 29.8 mmol/L — ABNORMAL HIGH (ref 20.0–28.0)
Drawn by: 164
FIO2: 40
O2 Saturation: 95.4 %
Patient temperature: 37.1
pCO2 arterial: 52.5 mmHg — ABNORMAL HIGH (ref 32.0–48.0)
pH, Arterial: 7.373 (ref 7.350–7.450)
pO2, Arterial: 71.7 mmHg — ABNORMAL LOW (ref 83.0–108.0)

## 2020-12-09 LAB — RENAL FUNCTION PANEL
Albumin: 2.3 g/dL — ABNORMAL LOW (ref 3.5–5.0)
Anion gap: 9 (ref 5–15)
BUN: 56 mg/dL — ABNORMAL HIGH (ref 8–23)
CO2: 28 mmol/L (ref 22–32)
Calcium: 8.6 mg/dL — ABNORMAL LOW (ref 8.9–10.3)
Chloride: 96 mmol/L — ABNORMAL LOW (ref 98–111)
Creatinine, Ser: 2.85 mg/dL — ABNORMAL HIGH (ref 0.61–1.24)
GFR, Estimated: 24 mL/min — ABNORMAL LOW (ref >60)
Glucose, Bld: 136 mg/dL — ABNORMAL HIGH (ref 70–99)
Phosphorus: 2.8 mg/dL (ref 2.5–4.6)
Potassium: 3.5 mmol/L (ref 3.5–5.1)
Sodium: 133 mmol/L — ABNORMAL LOW (ref 135–145)

## 2020-12-09 LAB — CBC
HCT: 28.8 % — ABNORMAL LOW (ref 39.0–52.0)
Hemoglobin: 8.6 g/dL — ABNORMAL LOW (ref 13.0–17.0)
MCH: 29.4 pg (ref 26.0–34.0)
MCHC: 29.9 g/dL — ABNORMAL LOW (ref 30.0–36.0)
MCV: 98.3 fL (ref 80.0–100.0)
Platelets: 223 10*3/uL (ref 150–400)
RBC: 2.93 MIL/uL — ABNORMAL LOW (ref 4.22–5.81)
RDW: 18.9 % — ABNORMAL HIGH (ref 11.5–15.5)
WBC: 8 10*3/uL (ref 4.0–10.5)
nRBC: 0 % (ref 0.0–0.2)

## 2020-12-09 NOTE — Progress Notes (Signed)
Pulmonary Critical Care Medicine [place]   PULMONARY CRITICAL CARE SERVICE  PROGRESS NOTE  Date of Service: 12/09/2020  Eric Spence  KGU:[id number]  DOB: 1956/01/21   DOA: 11/23/2020  Referring Physician: Merton Border, MD  HPI: Eric Spence is a 65 y.o. male seen for follow up of Acute on Chronic Respiratory Failure.  Patient is on no T collar currently on 35% FiO2 with a goal of 20 hours  Medications: Reviewed on Rounds  Physical Exam:  Vitals: Temperature is 97.5 pulse 64 respiratory rate is 20 blood pressure is 128/70 saturations 100%  Ventilator Settings on T collar with an FiO2 of 35%   General: Comfortable at this time  Eyes: Grossly normal lids, irises & conjunctiva  ENT: grossly tongue is normal  Neck: no obvious mass  Cardiovascular: S1 S2 normal no gallop  Respiratory: Scattered rhonchi expansion is equal  Abdomen: soft  Skin: no rash seen on limited exam  Musculoskeletal: not rigid  Psychiatric:unable to assess  Neurologic: no seizure no involuntary movements         Lab Data:   Basic Metabolic Panel: Recent Labs  Lab 12/03/20 0916 12/04/20 1003 12/05/20 1230 12/07/20 0500 12/09/20 0524  NA  --   --  134* 131* 133*  K 3.2* 3.5 3.3* 3.2* 3.5  CL  --   --  97* 95* 96*  CO2  --   --  26 26 28   GLUCOSE  --   --  128* 132* 136*  BUN  --   --  73* 55* 56*  CREATININE  --   --  3.46* 2.90* 2.85*  CALCIUM  --   --  8.6* 8.6* 8.6*  PHOS  --   --  2.5 2.0* 2.8    ABG: No results for input(s): PHART, PCO2ART, PO2ART, HCO3, O2SAT in the last 168 hours.  Liver Function Tests: Recent Labs  Lab 12/05/20 1230 12/07/20 0500 12/09/20 0524  ALBUMIN 2.1* 2.1* 2.3*   No results for input(s): LIPASE, AMYLASE in the last 168 hours. No results for input(s): AMMONIA in the last 168 hours.  CBC: Recent Labs  Lab 12/05/20 1230 12/07/20 0500 12/09/20 0524  WBC 7.4 7.3 8.0  HGB 8.0* 7.7* 8.6*  HCT 27.2* 25.6* 28.8*  MCV  98.9 98.1 98.3  PLT 220 212 223    Cardiac Enzymes: No results for input(s): CKTOTAL, CKMB, CKMBINDEX, TROPONINI in the last 168 hours.  BNP (last 3 results) No results for input(s): BNP in the last 8760 hours.  ProBNP (last 3 results) No results for input(s): PROBNP in the last 8760 hours.  Radiological Exams: CT HEAD WO CONTRAST  Result Date: 12/09/2020 CLINICAL DATA:  Recent TIA EXAM: CT HEAD WITHOUT CONTRAST TECHNIQUE: Contiguous axial images were obtained from the base of the skull through the vertex without intravenous contrast. COMPARISON:  None. FINDINGS: Brain: Mild atrophic changes are noted. No findings to suggest acute hemorrhage, acute infarction or space-occupying mass lesion are noted. Vascular: No hyperdense vessel or unexpected calcification. Skull: Diffuse osseous metastatic disease is noted. Sinuses/Orbits: No acute finding. Other: None. IMPRESSION: Bony metastatic disease. No acute intracranial abnormality is noted. Electronically Signed   By: Inez Catalina M.D.   On: 12/09/2020 01:51    Assessment/Plan Active Problems:   Acute on chronic respiratory failure with hypoxia (HCC)   Atrial fibrillation with rapid ventricular response (HCC)   End stage renal failure on dialysis [facility])   Cardiac arrest (HCC)   Non-STEMI (non-ST  elevated myocardial infarction) ([place])   1. Acute on chronic respiratory failure hypoxia continue to wean on T collar as tolerated.  Continue secretion management supportive care 2. Atrial fibrillation rate is controlled 3. End-stage renal failure on hemodialysis 4. Cardiac arrest rhythm stable 5. Non-STEMI no change we will continue to follow   I have personally seen and evaluated the patient, evaluated laboratory and imaging results, formulated the assessment and plan and placed orders. The Patient requires high complexity decision making with multiple systems involvement.  Rounds were done with the Respiratory Therapy Director and Staff  therapists and discussed with nursing staff also.  [name], MD [facility] Pulmonary Critical Care Medicine Sleep Medicine

## 2020-12-09 NOTE — Progress Notes (Signed)
[facility]  ROUNDING NOTE   Subjective:  Patient seen and evaluated at bedside. Due for hemodialysis treatment today. Otherwise resting comfortably at the moment.   Objective:  Vital signs in last 24 hours:  Temperature 97.5 pulse 64 respirations 20 blood pressure 128/70   Physical Exam: General:  Chronically ill-appearing  Head:  Normocephalic, atraumatic. Moist oral mucosal membranes  Eyes:  Anicteric  Neck:  Tracheostomy in place  Lungs:   Clear to auscultation, normal effort  Heart:  S1S2 irregular  Abdomen:   Soft, nontender, bowel sounds present  Extremities:  Trace peripheral edema.  Neurologic:  Awake, alert, following commands  Skin:  No acute rash  Access:  Left upper extremity AV fistula    Basic Metabolic Panel: Recent Labs  Lab 12/03/20 0916 12/04/20 1003 12/05/20 1230 12/07/20 0500 12/09/20 0524  NA  --   --  134* 131* 133*  K 3.2* 3.5 3.3* 3.2* 3.5  CL  --   --  97* 95* 96*  CO2  --   --  26 26 28   GLUCOSE  --   --  128* 132* 136*  BUN  --   --  73* 55* 56*  CREATININE  --   --  3.46* 2.90* 2.85*  CALCIUM  --   --  8.6* 8.6* 8.6*  PHOS  --   --  2.5 2.0* 2.8    Liver Function Tests: Recent Labs  Lab 12/05/20 1230 12/07/20 0500 12/09/20 0524  ALBUMIN 2.1* 2.1* 2.3*   No results for input(s): LIPASE, AMYLASE in the last 168 hours. No results for input(s): AMMONIA in the last 168 hours.  CBC: Recent Labs  Lab 12/05/20 1230 12/07/20 0500 12/09/20 0524  WBC 7.4 7.3 8.0  HGB 8.0* 7.7* 8.6*  HCT 27.2* 25.6* 28.8*  MCV 98.9 98.1 98.3  PLT 220 212 223    Cardiac Enzymes: No results for input(s): CKTOTAL, CKMB, CKMBINDEX, TROPONINI in the last 168 hours.  BNP: Invalid input(s): POCBNP  CBG: No results for input(s): GLUCAP in the last 168 hours.  Microbiology: Results for orders placed or performed during the hospital encounter of 11/05/2020  C Difficile Quick Screen (NO PCR Reflex)     Status: Abnormal   Collection  Time: 11/09/20  9:59 AM   Specimen: STOOL  Result Value Ref Range Status   C Diff antigen POSITIVE (A) NEGATIVE Final   C Diff toxin NEGATIVE NEGATIVE Final   C Diff interpretation   Final    Results are indeterminate. Please contact the provider listed for your campus for C diff questions in [place].    Comment: Performed at [facility], [place] [address] [place], [place] [address]    Coagulation Studies: No results for input(s): LABPROT, INR in the last 72 hours.  Urinalysis: No results for input(s): COLORURINE, LABSPEC, PHURINE, GLUCOSEU, HGBUR, BILIRUBINUR, KETONESUR, PROTEINUR, UROBILINOGEN, NITRITE, LEUKOCYTESUR in the last 72 hours.  Invalid input(s): APPERANCEUR    Imaging: CT HEAD WO CONTRAST  Result Date: 12/09/2020 CLINICAL DATA:  Recent TIA EXAM: CT HEAD WITHOUT CONTRAST TECHNIQUE: Contiguous axial images were obtained from the base of the skull through the vertex without intravenous contrast. COMPARISON:  None. FINDINGS: Brain: Mild atrophic changes are noted. No findings to suggest acute hemorrhage, acute infarction or space-occupying mass lesion are noted. Vascular: No hyperdense vessel or unexpected calcification. Skull: Diffuse osseous metastatic disease is noted. Sinuses/Orbits: No acute finding. Other: None. IMPRESSION: Bony metastatic disease. No acute intracranial abnormality is noted.  Electronically Signed   By: Inez Catalina M.D.   On: 12/09/2020 01:51     Medications:       Assessment/ Plan:  65 y.o. 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  1.  ESRD on HD MWF.    Patient due for hemodialysis treatment today.  Orders  have been prepared.  2.  Acute respiratory failure.  Patient remains on the ventilator at this time.  He has been difficult to wean from the ventilator.  3.  Anemia of chronic kidney disease.  Lab Results  Component Value Date   HGB 8.6 (L) 12/09/2020  Hemoglobin 8.6.  Maintain the patient on Retacrit 10,000 units IV with dialysis.  4.  Secondary hyperparathyroidism.  Lab Results  Component Value Date   CALCIUM 8.6 (L) 12/09/2020   CAION 1.17 11/26/2020   PHOS 2.8 12/09/2020  Phosphorus improved post administration of sodium phosphorus.   LOS: 0 Jenea Dake 3/11/20228:13 AM

## 2020-12-10 DIAGNOSIS — N186 End stage renal disease: Secondary | ICD-10-CM | POA: Diagnosis not present

## 2020-12-10 DIAGNOSIS — I4891 Unspecified atrial fibrillation: Secondary | ICD-10-CM | POA: Diagnosis not present

## 2020-12-10 DIAGNOSIS — J9621 Acute and chronic respiratory failure with hypoxia: Secondary | ICD-10-CM | POA: Diagnosis not present

## 2020-12-10 DIAGNOSIS — I469 Cardiac arrest, cause unspecified: Secondary | ICD-10-CM | POA: Diagnosis not present

## 2020-12-10 NOTE — Progress Notes (Signed)
Pulmonary Critical Care Medicine [place]   PULMONARY CRITICAL CARE SERVICE  PROGRESS NOTE  Date of Service: 12/10/2020  Eric Spence  BHA:[id number]  DOB: 1955/10/19   DOA: 11/26/2020  Referring Physician: Merton Border, MD  HPI: Eric Spence is a 65 y.o. male seen for follow up of Acute on Chronic Respiratory Failure.  Patient right now is on the ventilator and full support on T collar goal of 20 hours today  Medications: Reviewed on Rounds  Physical Exam:  Vitals: Temperature is 97.6 pulse 63 respiratory rate is 20 blood pressure 132/69 saturations 100%  Ventilator Settings on assist control FiO2 35% tidal volume 500 PEEP 5  . General: Comfortable at this time . Eyes: Grossly normal lids, irises & conjunctiva . ENT: grossly tongue is normal . Neck: no obvious mass . Cardiovascular: S1 S2 normal no gallop . Respiratory: Scattered rhonchi expansion equal . Abdomen: soft . Skin: no rash seen on limited exam . Musculoskeletal: not rigid . Psychiatric:unable to assess . Neurologic: no seizure no involuntary movements         Lab Data:   Basic Metabolic Panel: Recent Labs  Lab 12/04/20 1003 12/05/20 1230 12/07/20 0500 12/09/20 0524  NA  --  134* 131* 133*  K 3.5 3.3* 3.2* 3.5  CL  --  97* 95* 96*  CO2  --  26 26 28   GLUCOSE  --  128* 132* 136*  BUN  --  73* 55* 56*  CREATININE  --  3.46* 2.90* 2.85*  CALCIUM  --  8.6* 8.6* 8.6*  PHOS  --  2.5 2.0* 2.8    ABG: Recent Labs  Lab 12/09/20 1824  PHART 7.373  PCO2ART 52.5*  PO2ART 71.7*  HCO3 29.8*  O2SAT 95.4    Liver Function Tests: Recent Labs  Lab 12/05/20 1230 12/07/20 0500 12/09/20 0524  ALBUMIN 2.1* 2.1* 2.3*   No results for input(s): LIPASE, AMYLASE in the last 168 hours. No results for input(s): AMMONIA in the last 168 hours.  CBC: Recent Labs  Lab 12/05/20 1230 12/07/20 0500 12/09/20 0524  WBC 7.4 7.3 8.0  HGB 8.0* 7.7* 8.6*  HCT 27.2* 25.6* 28.8*  MCV 98.9  98.1 98.3  PLT 220 212 223    Cardiac Enzymes: No results for input(s): CKTOTAL, CKMB, CKMBINDEX, TROPONINI in the last 168 hours.  BNP (last 3 results) No results for input(s): BNP in the last 8760 hours.  ProBNP (last 3 results) No results for input(s): PROBNP in the last 8760 hours.  Radiological Exams: CT HEAD WO CONTRAST  Result Date: 12/09/2020 CLINICAL DATA:  Recent TIA EXAM: CT HEAD WITHOUT CONTRAST TECHNIQUE: Contiguous axial images were obtained from the base of the skull through the vertex without intravenous contrast. COMPARISON:  None. FINDINGS: Brain: Mild atrophic changes are noted. No findings to suggest acute hemorrhage, acute infarction or space-occupying mass lesion are noted. Vascular: No hyperdense vessel or unexpected calcification. Skull: Diffuse osseous metastatic disease is noted. Sinuses/Orbits: No acute finding. Other: None. IMPRESSION: Bony metastatic disease. No acute intracranial abnormality is noted. Electronically Signed   By: Inez Catalina M.D.   On: 12/09/2020 01:51    Assessment/Plan Active Problems:   Acute on chronic respiratory failure with hypoxia (HCC)   Atrial fibrillation with rapid ventricular response (HCC)   End stage renal failure on dialysis [facility])   Cardiac arrest ([place])   Non-STEMI (non-ST elevated myocardial infarction) ([place])   1. Acute on chronic respiratory failure hypoxia plan is to  wean on T collar for 20-hour goal will continue to advance as tolerated 2. Atrial fibrillation rate controlled we will continue with supportive care 3. End-stage renal failure on hemodialysis 4. Cardiac arrest rhythm has been stable 5. Non-STEMI no change continue supportive care   I have personally seen and evaluated the patient, evaluated laboratory and imaging results, formulated the assessment and plan and placed orders. The Patient requires high complexity decision making with multiple systems involvement.  Rounds were done with the Respiratory  Therapy Director and Staff therapists and discussed with nursing staff also.  [name], MD [facility] Pulmonary Critical Care Medicine Sleep Medicine

## 2020-12-12 DIAGNOSIS — I469 Cardiac arrest, cause unspecified: Secondary | ICD-10-CM | POA: Diagnosis not present

## 2020-12-12 DIAGNOSIS — N186 End stage renal disease: Secondary | ICD-10-CM | POA: Diagnosis not present

## 2020-12-12 DIAGNOSIS — J9621 Acute and chronic respiratory failure with hypoxia: Secondary | ICD-10-CM | POA: Diagnosis not present

## 2020-12-12 DIAGNOSIS — I4891 Unspecified atrial fibrillation: Secondary | ICD-10-CM | POA: Diagnosis not present

## 2020-12-12 LAB — RENAL FUNCTION PANEL
Albumin: 2 g/dL — ABNORMAL LOW (ref 3.5–5.0)
Anion gap: 7 (ref 5–15)
BUN: 97 mg/dL — ABNORMAL HIGH (ref 8–23)
CO2: 28 mmol/L (ref 22–32)
Calcium: 8.6 mg/dL — ABNORMAL LOW (ref 8.9–10.3)
Chloride: 98 mmol/L (ref 98–111)
Creatinine, Ser: 3.3 mg/dL — ABNORMAL HIGH (ref 0.61–1.24)
GFR, Estimated: 20 mL/min — ABNORMAL LOW (ref >60)
Glucose, Bld: 122 mg/dL — ABNORMAL HIGH (ref 70–99)
Phosphorus: 1.7 mg/dL — ABNORMAL LOW (ref 2.5–4.6)
Potassium: 3.6 mmol/L (ref 3.5–5.1)
Sodium: 133 mmol/L — ABNORMAL LOW (ref 135–145)

## 2020-12-12 LAB — CBC
HCT: 24.9 % — ABNORMAL LOW (ref 39.0–52.0)
Hemoglobin: 7.7 g/dL — ABNORMAL LOW (ref 13.0–17.0)
MCH: 30 pg (ref 26.0–34.0)
MCHC: 30.9 g/dL (ref 30.0–36.0)
MCV: 96.9 fL (ref 80.0–100.0)
Platelets: 186 10*3/uL (ref 150–400)
RBC: 2.57 MIL/uL — ABNORMAL LOW (ref 4.22–5.81)
RDW: 18.2 % — ABNORMAL HIGH (ref 11.5–15.5)
WBC: 8.5 10*3/uL (ref 4.0–10.5)
nRBC: 0 % (ref 0.0–0.2)

## 2020-12-12 NOTE — Progress Notes (Signed)
[facility]  ROUNDING NOTE   Subjective:  Patient due for hemodialysis treatment later today. Resting comfortably. Still on the ventilator.   Objective:  Vital signs in last 24 hours:  Temperature 98 pulse 57 respirations 25 blood pressure 120/63   Physical Exam: General:  Chronically ill-appearing  Head:  Normocephalic, atraumatic. Moist oral mucosal membranes  Eyes:  Anicteric  Neck:  Tracheostomy in place  Lungs:   Clear to auscultation, normal effort  Heart:  S1S2 irregular  Abdomen:   Soft, nontender, bowel sounds present  Extremities:  Trace peripheral edema.  Neurologic:  Awake, alert, following commands  Skin:  No acute rash  Access:  Left upper extremity AV fistula    Basic Metabolic Panel: Recent Labs  Lab 12/05/20 1230 12/07/20 0500 12/09/20 0524 12/12/20 0524  NA 134* 131* 133* 133*  K 3.3* 3.2* 3.5 3.6  CL 97* 95* 96* 98  CO2 26 26 28 28   GLUCOSE 128* 132* 136* 122*  BUN 73* 55* 56* 97*  CREATININE 3.46* 2.90* 2.85* 3.30*  CALCIUM 8.6* 8.6* 8.6* 8.6*  PHOS 2.5 2.0* 2.8 1.7*    Liver Function Tests: Recent Labs  Lab 12/05/20 1230 12/07/20 0500 12/09/20 0524 12/12/20 0524  ALBUMIN 2.1* 2.1* 2.3* 2.0*   No results for input(s): LIPASE, AMYLASE in the last 168 hours. No results for input(s): AMMONIA in the last 168 hours.  CBC: Recent Labs  Lab 12/05/20 1230 12/07/20 0500 12/09/20 0524 12/12/20 0524  WBC 7.4 7.3 8.0 8.5  HGB 8.0* 7.7* 8.6* 7.7*  HCT 27.2* 25.6* 28.8* 24.9*  MCV 98.9 98.1 98.3 96.9  PLT 220 212 223 186    Cardiac Enzymes: No results for input(s): CKTOTAL, CKMB, CKMBINDEX, TROPONINI in the last 168 hours.  BNP: Invalid input(s): POCBNP  CBG: No results for input(s): GLUCAP in the last 168 hours.  Microbiology: Results for orders placed or performed during the hospital encounter of 11/03/2020  C Difficile Quick Screen (NO PCR Reflex)     Status: Abnormal   Collection Time: 11/09/20  9:59 AM    Specimen: STOOL  Result Value Ref Range Status   C Diff antigen POSITIVE (A) NEGATIVE Final   C Diff toxin NEGATIVE NEGATIVE Final   C Diff interpretation   Final    Results are indeterminate. Please contact the provider listed for your campus for C diff questions in [place].    Comment: Performed at [facility], [place] [address], [place], [place] [address]    Coagulation Studies: No results for input(s): LABPROT, INR in the last 72 hours.  Urinalysis: No results for input(s): COLORURINE, LABSPEC, PHURINE, GLUCOSEU, HGBUR, BILIRUBINUR, KETONESUR, PROTEINUR, UROBILINOGEN, NITRITE, LEUKOCYTESUR in the last 72 hours.  Invalid input(s): APPERANCEUR    Imaging: No results found.   Medications:       Assessment/ Plan:  65 y.o. 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  1.  ESRD on HD MWF.    Patient will be maintained on MWF dialysis schedule.  2.  Acute respiratory failure.  Still on the ventilator currently.  Weaning as per pulmonary/critical care..  3.  Anemia of chronic kidney disease.  Lab Results  Component Value Date   HGB 7.7 (  L) 12/12/2020  Hemoglobin continues to drift up and down.  Currently hemoglobin 7.7.  Continue Retacrit 10,000 units with dialysis.  4.  Secondary hyperparathyroidism.  Lab Results  Component Value Date   CALCIUM 8.6 (L) 12/12/2020   CAION 1.17 11/09/2020   PHOS 1.7 (L) 12/12/2020  Continues to have intermittent low phosphorus.  Replete periodically.   LOS: 0 Ayrton Mcvay 3/14/20227:57 AM

## 2020-12-12 NOTE — Progress Notes (Signed)
Pulmonary Critical Care Medicine [place]   PULMONARY CRITICAL CARE SERVICE  PROGRESS NOTE  Date of Service: 12/12/2020  Eric Spence  [id number]  DOB: [birth year]   DOA: 11/05/2020  Referring Physician: Merton Border, MD  HPI: Eric Spence is a 65 y.o. male seen for follow up of Acute on Chronic Respiratory Failure.  Patient is currently on T collar has been on for a goal of up to 20 hours  Medications: Reviewed on Rounds  Physical Exam:  Vitals: Temperature is 98.0 pulse 57 respiratory 25 blood pressure is 120/63 saturations 99%  Ventilator Settings on T collar goal of 20 hours  . General: Comfortable at this time . Eyes: Grossly normal lids, irises & conjunctiva . ENT: grossly tongue is normal . Neck: no obvious mass . Cardiovascular: S1 S2 normal no gallop . Respiratory: No rhonchi very coarse breath sounds . Abdomen: soft . Skin: no rash seen on limited exam . Musculoskeletal: not rigid . Psychiatric:unable to assess . Neurologic: no seizure no involuntary movements         Lab Data:   Basic Metabolic Panel: Recent Labs  Lab 12/05/20 1230 12/07/20 0500 12/09/20 0524 12/12/20 0524  NA 134* 131* 133* 133*  K 3.3* 3.2* 3.5 3.6  CL 97* 95* 96* 98  CO2 26 26 28 28   GLUCOSE 128* 132* 136* 122*  BUN 73* 55* 56* 97*  CREATININE 3.46* 2.90* 2.85* 3.30*  CALCIUM 8.6* 8.6* 8.6* 8.6*  PHOS 2.5 2.0* 2.8 1.7*    ABG: Recent Labs  Lab 12/09/20 1824  PHART 7.373  PCO2ART 52.5*  PO2ART 71.7*  HCO3 29.8*  O2SAT 95.4    Liver Function Tests: Recent Labs  Lab 12/05/20 1230 12/07/20 0500 12/09/20 0524 12/12/20 0524  ALBUMIN 2.1* 2.1* 2.3* 2.0*   No results for input(s): LIPASE, AMYLASE in the last 168 hours. No results for input(s): AMMONIA in the last 168 hours.  CBC: Recent Labs  Lab 12/05/20 1230 12/07/20 0500 12/09/20 0524 12/12/20 0524  WBC 7.4 7.3 8.0 8.5  HGB 8.0* 7.7* 8.6* 7.7*  HCT 27.2* 25.6* 28.8* 24.9*  MCV  98.9 98.1 98.3 96.9  PLT 220 212 223 186    Cardiac Enzymes: No results for input(s): CKTOTAL, CKMB, CKMBINDEX, TROPONINI in the last 168 hours.  BNP (last 3 results) No results for input(s): BNP in the last 8760 hours.  ProBNP (last 3 results) No results for input(s): PROBNP in the last 8760 hours.  Radiological Exams: No results found.  Assessment/Plan Active Problems:   Acute on chronic respiratory failure with hypoxia (HCC)   Atrial fibrillation with rapid ventricular response (HCC)   End stage renal failure on dialysis [facility])   Cardiac arrest ([place])   Non-STEMI (non-ST elevated myocardial infarction) ([place])   1. Acute on chronic respiratory failure hypoxia patient is currently on 20-hour goal of T collar. 2. Atrial fibrillation rate is controlled 3. End-stage renal failure on hemodialysis 4. Cardiac arrest rhythm stable 5. Non-STEMI supportive care   I have personally seen and evaluated the patient, evaluated laboratory and imaging results, formulated the assessment and plan and placed orders. The Patient requires high complexity decision making with multiple systems involvement.  Rounds were done with the Respiratory Therapy Director and Staff therapists and discussed with nursing staff also.  [name], MD [facility] Pulmonary Critical Care Medicine Sleep Medicine

## 2020-12-13 DIAGNOSIS — I4891 Unspecified atrial fibrillation: Secondary | ICD-10-CM | POA: Diagnosis not present

## 2020-12-13 DIAGNOSIS — I469 Cardiac arrest, cause unspecified: Secondary | ICD-10-CM | POA: Diagnosis not present

## 2020-12-13 DIAGNOSIS — J9621 Acute and chronic respiratory failure with hypoxia: Secondary | ICD-10-CM | POA: Diagnosis not present

## 2020-12-13 DIAGNOSIS — N186 End stage renal disease: Secondary | ICD-10-CM | POA: Diagnosis not present

## 2020-12-13 NOTE — Progress Notes (Signed)
Pulmonary Critical Care Medicine [place]   PULMONARY CRITICAL CARE SERVICE  PROGRESS NOTE  Date of Service: 12/13/2020  Eric Spence  [id number]  DOB: 1956/03/06   DOA: 11/04/2020  Referring Physician: Merton Border, MD  HPI: Eric Spence is a 65 y.o. male seen for follow up of Acute on Chronic Respiratory Failure.  Patient is off the ventilator on T collar currently 40% FiO2  Medications: Reviewed on Rounds  Physical Exam:  Vitals: Temperature is 98.4 pulse 107 respiratory 20 blood pressure is 105/56 saturations 100%  Ventilator Settings on T collar FiO2 of 40%  . General: Comfortable at this time . Eyes: Grossly normal lids, irises & conjunctiva . ENT: grossly tongue is normal . Neck: no obvious mass . Cardiovascular: S1 S2 normal no gallop . Respiratory: No rhonchi very coarse breath sound . Abdomen: soft . Skin: no rash seen on limited exam . Musculoskeletal: not rigid . Psychiatric:unable to assess . Neurologic: no seizure no involuntary movements         Lab Data:   Basic Metabolic Panel: Recent Labs  Lab 12/07/20 0500 12/09/20 0524 12/12/20 0524  NA 131* 133* 133*  K 3.2* 3.5 3.6  CL 95* 96* 98  CO2 26 28 28   GLUCOSE 132* 136* 122*  BUN 55* 56* 97*  CREATININE 2.90* 2.85* 3.30*  CALCIUM 8.6* 8.6* 8.6*  PHOS 2.0* 2.8 1.7*    ABG: Recent Labs  Lab 12/09/20 1824  PHART 7.373  PCO2ART 52.5*  PO2ART 71.7*  HCO3 29.8*  O2SAT 95.4    Liver Function Tests: Recent Labs  Lab 12/07/20 0500 12/09/20 0524 12/12/20 0524  ALBUMIN 2.1* 2.3* 2.0*   No results for input(s): LIPASE, AMYLASE in the last 168 hours. No results for input(s): AMMONIA in the last 168 hours.  CBC: Recent Labs  Lab 12/07/20 0500 12/09/20 0524 12/12/20 0524  WBC 7.3 8.0 8.5  HGB 7.7* 8.6* 7.7*  HCT 25.6* 28.8* 24.9*  MCV 98.1 98.3 96.9  PLT 212 223 186    Cardiac Enzymes: No results for input(s): CKTOTAL, CKMB, CKMBINDEX, TROPONINI in  the last 168 hours.  BNP (last 3 results) No results for input(s): BNP in the last 8760 hours.  ProBNP (last 3 results) No results for input(s): PROBNP in the last 8760 hours.  Radiological Exams: No results found.  Assessment/Plan Active Problems:   Acute on chronic respiratory failure with hypoxia (HCC)   Atrial fibrillation with rapid ventricular response (HCC)   End stage renal failure on dialysis [facility])   Cardiac arrest ([place])   Non-STEMI (non-ST elevated myocardial infarction) ([place])   1. Acute on chronic respiratory failure hypoxia plan is to continue with T collar titrate oxygen continue pulmonary toilet. 2. Atrial fibrillation rate is controlled  3. Renal failure on dialysis 4. Cardiac arrest rhythm is stable 5. Non-STEMI no change we will continue to follow along   I have personally seen and evaluated the patient, evaluated laboratory and imaging results, formulated the assessment and plan and placed orders. The Patient requires high complexity decision making with multiple systems involvement.  Rounds were done with the Respiratory Therapy Director and Staff therapists and discussed with nursing staff also.  [name], MD [facility] Pulmonary Critical Care Medicine Sleep Medicine

## 2020-12-14 DIAGNOSIS — I469 Cardiac arrest, cause unspecified: Secondary | ICD-10-CM | POA: Diagnosis not present

## 2020-12-14 DIAGNOSIS — N186 End stage renal disease: Secondary | ICD-10-CM | POA: Diagnosis not present

## 2020-12-14 DIAGNOSIS — J9621 Acute and chronic respiratory failure with hypoxia: Secondary | ICD-10-CM | POA: Diagnosis not present

## 2020-12-14 DIAGNOSIS — I4891 Unspecified atrial fibrillation: Secondary | ICD-10-CM | POA: Diagnosis not present

## 2020-12-14 LAB — CBC
HCT: 24.4 % — ABNORMAL LOW (ref 39.0–52.0)
Hemoglobin: 7.5 g/dL — ABNORMAL LOW (ref 13.0–17.0)
MCH: 29.3 pg (ref 26.0–34.0)
MCHC: 30.7 g/dL (ref 30.0–36.0)
MCV: 95.3 fL (ref 80.0–100.0)
Platelets: 187 10*3/uL (ref 150–400)
RBC: 2.56 MIL/uL — ABNORMAL LOW (ref 4.22–5.81)
RDW: 18.3 % — ABNORMAL HIGH (ref 11.5–15.5)
WBC: 7.5 10*3/uL (ref 4.0–10.5)
nRBC: 0 % (ref 0.0–0.2)

## 2020-12-14 LAB — RENAL FUNCTION PANEL
Albumin: 1.8 g/dL — ABNORMAL LOW (ref 3.5–5.0)
Anion gap: 9 (ref 5–15)
BUN: 82 mg/dL — ABNORMAL HIGH (ref 8–23)
CO2: 28 mmol/L (ref 22–32)
Calcium: 8.5 mg/dL — ABNORMAL LOW (ref 8.9–10.3)
Chloride: 96 mmol/L — ABNORMAL LOW (ref 98–111)
Creatinine, Ser: 2.89 mg/dL — ABNORMAL HIGH (ref 0.61–1.24)
GFR, Estimated: 24 mL/min — ABNORMAL LOW (ref >60)
Glucose, Bld: 114 mg/dL — ABNORMAL HIGH (ref 70–99)
Phosphorus: 1.7 mg/dL — ABNORMAL LOW (ref 2.5–4.6)
Potassium: 3 mmol/L — ABNORMAL LOW (ref 3.5–5.1)
Sodium: 133 mmol/L — ABNORMAL LOW (ref 135–145)

## 2020-12-14 NOTE — Progress Notes (Signed)
Pulmonary Critical Care Medicine [place]   PULMONARY CRITICAL CARE SERVICE  PROGRESS NOTE  Date of Service: 12/14/2020  Eric Spence  [id number]  DOB: December 03, 1955   DOA: 11/22/2020  Referring Physician: Merton Border, MD  HPI: Eric Spence is a 65 y.o. male seen for follow up of Acute on Chronic Respiratory Failure.  At this time patient is afebrile comfortable is benign T collar going for 20 hours  Medications: Reviewed on Rounds  Physical Exam:  Vitals: Temperature 97.9 pulse 67 respiratory rate 30 blood pressure is 119/58 saturations 95%  Ventilator Settings T collar currently on 35% FiO2  . General: Comfortable at this time . Eyes: Grossly normal lids, irises & conjunctiva . ENT: grossly tongue is normal . Neck: no obvious mass . Cardiovascular: S1 S2 normal no gallop . Respiratory: No rhonchi or rales noted at this time . Abdomen: soft . Skin: no rash seen on limited exam . Musculoskeletal: not rigid . Psychiatric:unable to assess . Neurologic: no seizure no involuntary movements         Lab Data:   Basic Metabolic Panel: Recent Labs  Lab 12/09/20 0524 12/12/20 0524 12/14/20 0517  NA 133* 133* 133*  K 3.5 3.6 3.0*  CL 96* 98 96*  CO2 28 28 28   GLUCOSE 136* 122* 114*  BUN 56* 97* 82*  CREATININE 2.85* 3.30* 2.89*  CALCIUM 8.6* 8.6* 8.5*  PHOS 2.8 1.7* 1.7*    ABG: Recent Labs  Lab 12/09/20 1824  PHART 7.373  PCO2ART 52.5*  PO2ART 71.7*  HCO3 29.8*  O2SAT 95.4    Liver Function Tests: Recent Labs  Lab 12/09/20 0524 12/12/20 0524 12/14/20 0517  ALBUMIN 2.3* 2.0* 1.8*   No results for input(s): LIPASE, AMYLASE in the last 168 hours. No results for input(s): AMMONIA in the last 168 hours.  CBC: Recent Labs  Lab 12/09/20 0524 12/12/20 0524 12/14/20 0517  WBC 8.0 8.5 7.5  HGB 8.6* 7.7* 7.5*  HCT 28.8* 24.9* 24.4*  MCV 98.3 96.9 95.3  PLT 223 186 187    Cardiac Enzymes: No results for input(s): CKTOTAL,  CKMB, CKMBINDEX, TROPONINI in the last 168 hours.  BNP (last 3 results) No results for input(s): BNP in the last 8760 hours.  ProBNP (last 3 results) No results for input(s): PROBNP in the last 8760 hours.  Radiological Exams: No results found.  Assessment/Plan Active Problems:   Acute on chronic respiratory failure with hypoxia (HCC)   Atrial fibrillation with rapid ventricular response (HCC)   End stage renal failure on dialysis [facility])   Cardiac arrest ([place])   Non-STEMI (non-ST elevated myocardial infarction) ([place])   1. Acute on chronic respiratory failure hypoxia we will continue with a T-piece wean goal of 20 hours 2. Atrial fibrillation rate controlled right now 3. End-stage renal failure on hemodialysis 4. Cardiac arrest rhythm has been stable we will continue to monitor 5. Non-STEMI no change   I have personally seen and evaluated the patient, evaluated laboratory and imaging results, formulated the assessment and plan and placed orders. The Patient requires high complexity decision making with multiple systems involvement.  Rounds were done with the Respiratory Therapy Director and Staff therapists and discussed with nursing staff also.  [name], MD [facility] Pulmonary Critical Care Medicine Sleep Medicine

## 2020-12-15 DIAGNOSIS — N186 End stage renal disease: Secondary | ICD-10-CM | POA: Diagnosis not present

## 2020-12-15 DIAGNOSIS — J9621 Acute and chronic respiratory failure with hypoxia: Secondary | ICD-10-CM | POA: Diagnosis not present

## 2020-12-15 DIAGNOSIS — I4891 Unspecified atrial fibrillation: Secondary | ICD-10-CM | POA: Diagnosis not present

## 2020-12-15 DIAGNOSIS — I469 Cardiac arrest, cause unspecified: Secondary | ICD-10-CM | POA: Diagnosis not present

## 2020-12-15 LAB — POTASSIUM: Potassium: 3.5 mmol/L (ref 3.5–5.1)

## 2020-12-15 NOTE — Progress Notes (Signed)
Pulmonary Critical Care Medicine [place]   PULMONARY CRITICAL CARE SERVICE  PROGRESS NOTE  Date of Service: 12/15/2020  Eric Spence  [id number]  DOB: 1955-12-29   DOA: 11/13/2020  Referring Physician: Merton Border, MD  HPI: Eric Spence is a 65 y.o. male seen for follow up of Acute on Chronic Respiratory Failure.  Patient currently is on T collar has been on goal of 20 hours  Medications: Reviewed on Rounds  Physical Exam:  Vitals: Temperature 97.2 pulse 69 respiratory rate is 30 blood pressure is 133/70 saturations 100%  Ventilator Settings on T collar with a goal 20 hours  . General: Comfortable at this time . Eyes: Grossly normal lids, irises & conjunctiva . ENT: grossly tongue is normal . Neck: no obvious mass . Cardiovascular: S1 S2 normal no gallop . Respiratory: No rhonchi no rales are noted at this . Abdomen: soft . Skin: no rash seen on limited exam . Musculoskeletal: not rigid . Psychiatric:unable to assess . Neurologic: no seizure no involuntary movements         Lab Data:   Basic Metabolic Panel: Recent Labs  Lab 12/09/20 0524 12/12/20 0524 12/14/20 0517  NA 133* 133* 133*  K 3.5 3.6 3.0*  CL 96* 98 96*  CO2 28 28 28   GLUCOSE 136* 122* 114*  BUN 56* 97* 82*  CREATININE 2.85* 3.30* 2.89*  CALCIUM 8.6* 8.6* 8.5*  PHOS 2.8 1.7* 1.7*    ABG: Recent Labs  Lab 12/09/20 1824  PHART 7.373  PCO2ART 52.5*  PO2ART 71.7*  HCO3 29.8*  O2SAT 95.4    Liver Function Tests: Recent Labs  Lab 12/09/20 0524 12/12/20 0524 12/14/20 0517  ALBUMIN 2.3* 2.0* 1.8*   No results for input(s): LIPASE, AMYLASE in the last 168 hours. No results for input(s): AMMONIA in the last 168 hours.  CBC: Recent Labs  Lab 12/09/20 0524 12/12/20 0524 12/14/20 0517  WBC 8.0 8.5 7.5  HGB 8.6* 7.7* 7.5*  HCT 28.8* 24.9* 24.4*  MCV 98.3 96.9 95.3  PLT 223 186 187    Cardiac Enzymes: No results for input(s): CKTOTAL, CKMB, CKMBINDEX,  TROPONINI in the last 168 hours.  BNP (last 3 results) No results for input(s): BNP in the last 8760 hours.  ProBNP (last 3 results) No results for input(s): PROBNP in the last 8760 hours.  Radiological Exams: No results found.  Assessment/Plan Active Problems:   Acute on chronic respiratory failure with hypoxia (HCC)   Atrial fibrillation with rapid ventricular response (HCC)   End stage renal failure on dialysis [facility])   Cardiac arrest ([employer])   Non-STEMI (non-ST elevated myocardial infarction) ([place])   1. Acute on chronic respiratory failure hypoxia plan is to continue with the weaning with T-piece.  Continue secretion management supportive care. 2. Atrial fibrillation rate is controlled at this time 3. End-stage renal failure on hemodialysis 4. Cardiac arrest rhythm stable 5. Non-STEMI no change   I have personally seen and evaluated the patient, evaluated laboratory and imaging results, formulated the assessment and plan and placed orders. The Patient requires high complexity decision making with multiple systems involvement.  Rounds were done with the Respiratory Therapy Director and Staff therapists and discussed with nursing staff also.  [name], MD [facility] Pulmonary Critical Care Medicine Sleep Medicine

## 2020-12-16 DIAGNOSIS — J9621 Acute and chronic respiratory failure with hypoxia: Secondary | ICD-10-CM | POA: Diagnosis not present

## 2020-12-16 DIAGNOSIS — I4891 Unspecified atrial fibrillation: Secondary | ICD-10-CM | POA: Diagnosis not present

## 2020-12-16 DIAGNOSIS — N186 End stage renal disease: Secondary | ICD-10-CM | POA: Diagnosis not present

## 2020-12-16 DIAGNOSIS — I469 Cardiac arrest, cause unspecified: Secondary | ICD-10-CM | POA: Diagnosis not present

## 2020-12-16 LAB — RENAL FUNCTION PANEL
Albumin: 1.9 g/dL — ABNORMAL LOW (ref 3.5–5.0)
Anion gap: 8 (ref 5–15)
BUN: 77 mg/dL — ABNORMAL HIGH (ref 8–23)
CO2: 28 mmol/L (ref 22–32)
Calcium: 8.7 mg/dL — ABNORMAL LOW (ref 8.9–10.3)
Chloride: 97 mmol/L — ABNORMAL LOW (ref 98–111)
Creatinine, Ser: 2.75 mg/dL — ABNORMAL HIGH (ref 0.61–1.24)
GFR, Estimated: 25 mL/min — ABNORMAL LOW (ref >60)
Glucose, Bld: 123 mg/dL — ABNORMAL HIGH (ref 70–99)
Phosphorus: 1.6 mg/dL — ABNORMAL LOW (ref 2.5–4.6)
Potassium: 3.5 mmol/L (ref 3.5–5.1)
Sodium: 133 mmol/L — ABNORMAL LOW (ref 135–145)

## 2020-12-16 LAB — CBC
HCT: 27.5 % — ABNORMAL LOW (ref 39.0–52.0)
Hemoglobin: 8.3 g/dL — ABNORMAL LOW (ref 13.0–17.0)
MCH: 28.9 pg (ref 26.0–34.0)
MCHC: 30.2 g/dL (ref 30.0–36.0)
MCV: 95.8 fL (ref 80.0–100.0)
Platelets: 183 10*3/uL (ref 150–400)
RBC: 2.87 MIL/uL — ABNORMAL LOW (ref 4.22–5.81)
RDW: 18 % — ABNORMAL HIGH (ref 11.5–15.5)
WBC: 7.4 10*3/uL (ref 4.0–10.5)
nRBC: 0 % (ref 0.0–0.2)

## 2020-12-16 LAB — HEPATITIS B SURFACE ANTIGEN: Hepatitis B Surface Ag: NONREACTIVE

## 2020-12-16 NOTE — Progress Notes (Signed)
Pulmonary Critical Care Medicine [place]   PULMONARY CRITICAL CARE SERVICE  PROGRESS NOTE  Date of Service: 12/16/2020  Eric Spence  [id number]  DOB: 04-26-1956   DOA: 11/05/2020  Referring Physician: Merton Border, MD  HPI: Eric Spence is a 65 y.o. male seen for follow up of Acute on Chronic Respiratory Failure.  Patient is comfortable right now without distress has been on pressure support mode on 35% FiO2  Medications: Reviewed on Rounds  Physical Exam:  Vitals: Temperature is 97.0 pulse 100 respiratory 20 blood pressure is 125/67 saturations 100%  Ventilator Settings on pressure support FiO2 35% pressure 12/5  . General: Comfortable at this time . Eyes: Grossly normal lids, irises & conjunctiva . ENT: grossly tongue is normal . Neck: no obvious mass . Cardiovascular: S1 S2 normal no gallop . Respiratory: No rhonchi very coarse breath sound . Abdomen: soft . Skin: no rash seen on limited exam . Musculoskeletal: not rigid . Psychiatric:unable to assess . Neurologic: no seizure no involuntary movements         Lab Data:   Basic Metabolic Panel: Recent Labs  Lab 12/12/20 0524 12/14/20 0517 12/15/20 1002 12/16/20 0531  NA 133* 133*  --  133*  K 3.6 3.0* 3.5 3.5  CL 98 96*  --  97*  CO2 28 28  --  28  GLUCOSE 122* 114*  --  123*  BUN 97* 82*  --  77*  CREATININE 3.30* 2.89*  --  2.75*  CALCIUM 8.6* 8.5*  --  8.7*  PHOS 1.7* 1.7*  --  1.6*    ABG: Recent Labs  Lab 12/09/20 1824  PHART 7.373  PCO2ART 52.5*  PO2ART 71.7*  HCO3 29.8*  O2SAT 95.4    Liver Function Tests: Recent Labs  Lab 12/12/20 0524 12/14/20 0517 12/16/20 0531  ALBUMIN 2.0* 1.8* 1.9*   No results for input(s): LIPASE, AMYLASE in the last 168 hours. No results for input(s): AMMONIA in the last 168 hours.  CBC: Recent Labs  Lab 12/12/20 0524 12/14/20 0517 12/16/20 0531  WBC 8.5 7.5 7.4  HGB 7.7* 7.5* 8.3*  HCT 24.9* 24.4* 27.5*  MCV 96.9 95.3  95.8  PLT 186 187 183    Cardiac Enzymes: No results for input(s): CKTOTAL, CKMB, CKMBINDEX, TROPONINI in the last 168 hours.  BNP (last 3 results) No results for input(s): BNP in the last 8760 hours.  ProBNP (last 3 results) No results for input(s): PROBNP in the last 8760 hours.  Radiological Exams: No results found.  Assessment/Plan Active Problems:   Acute on chronic respiratory failure with hypoxia (HCC)   Atrial fibrillation with rapid ventricular response (HCC)   End stage renal failure on dialysis [facility])   Cardiac arrest ([place])   Non-STEMI (non-ST elevated myocardial infarction) ([place])   1. Acute on chronic respiratory failure hypoxia we will continue with the wean patient did 3 hours of T collar yesterday try to advance further today. 2. Atrial fibrillation rate is controlled 3. End-stage renal failure on hemodialysis continue with supportive care 4. Cardiac arrest rhythm stable 5. Non-STEMI no change we will continue to follow along   I have personally seen and evaluated the patient, evaluated laboratory and imaging results, formulated the assessment and plan and placed orders. The Patient requires high complexity decision making with multiple systems involvement.  Rounds were done with the Respiratory Therapy Director and Staff therapists and discussed with nursing staff also.  [name], MD [facility] Pulmonary Critical  Care Medicine Sleep Medicine

## 2020-12-16 NOTE — Progress Notes (Signed)
[facility]  ROUNDING NOTE   Subjective:  Patient seen and evaluated at bedside. Due for dialysis treatment today. It appears that patient will likely be discharged to out-of-state facility for ongoing care.   Objective:  Vital signs in last 24 hours:  Temperature 97 pulse 60 respirations 20 blood pressure 125/67   Physical Exam: General:  Chronically ill-appearing  Head:  Normocephalic, atraumatic. Moist oral mucosal membranes  Eyes:  Anicteric  Neck:  Tracheostomy in place  Lungs:   Clear to auscultation, normal effort  Heart:  S1S2 irregular  Abdomen:   Soft, nontender, bowel sounds present  Extremities:  Trace peripheral edema.  Neurologic:  Awake, alert, following commands  Skin:  No acute rash  Access:  Left upper extremity AV fistula    Basic Metabolic Panel: Recent Labs  Lab 12/12/20 0524 12/14/20 0517 12/15/20 1002 12/16/20 0531  NA 133* 133*  --  133*  K 3.6 3.0* 3.5 3.5  CL 98 96*  --  97*  CO2 28 28  --  28  GLUCOSE 122* 114*  --  123*  BUN 97* 82*  --  77*  CREATININE 3.30* 2.89*  --  2.75*  CALCIUM 8.6* 8.5*  --  8.7*  PHOS 1.7* 1.7*  --  1.6*    Liver Function Tests: Recent Labs  Lab 12/12/20 0524 12/14/20 0517 12/16/20 0531  ALBUMIN 2.0* 1.8* 1.9*   No results for input(s): LIPASE, AMYLASE in the last 168 hours. No results for input(s): AMMONIA in the last 168 hours.  CBC: Recent Labs  Lab 12/12/20 0524 12/14/20 0517 12/16/20 0531  WBC 8.5 7.5 7.4  HGB 7.7* 7.5* 8.3*  HCT 24.9* 24.4* 27.5*  MCV 96.9 95.3 95.8  PLT 186 187 183    Cardiac Enzymes: No results for input(s): CKTOTAL, CKMB, CKMBINDEX, TROPONINI in the last 168 hours.  BNP: Invalid input(s): POCBNP  CBG: No results for input(s): GLUCAP in the last 168 hours.  Microbiology: Results for orders placed or performed during the hospital encounter of 11/11/2020  C Difficile Quick Screen (NO PCR Reflex)     Status: Abnormal   Collection Time: 11/09/20   9:59 AM   Specimen: STOOL  Result Value Ref Range Status   C Diff antigen POSITIVE (A) NEGATIVE Final   C Diff toxin NEGATIVE NEGATIVE Final   C Diff interpretation   Final    Results are indeterminate. Please contact the provider listed for your campus for C diff questions in [place].    Comment: Performed at [facility], [place] [address], [place], [place] [address]    Coagulation Studies: No results for input(s): LABPROT, INR in the last 72 hours.  Urinalysis: No results for input(s): COLORURINE, LABSPEC, PHURINE, GLUCOSEU, HGBUR, BILIRUBINUR, KETONESUR, PROTEINUR, UROBILINOGEN, NITRITE, LEUKOCYTESUR in the last 72 hours.  Invalid input(s): APPERANCEUR    Imaging: No results found.   Medications:       Assessment/ Plan:  64 y.o. 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  1.  ESRD on HD MWF.    Patient is due for hemodialysis treatment today.  Orders have been prepared.  2.  Acute respiratory failure.  Weaning efforts as per pulmonary critical care.  3.  Anemia of chronic kidney disease.  Lab Results  Component Value Date   HGB 8.3 (L) 12/16/2020  Hemoglobin currently 8.3.  Continue Retacrit 10,000 units IV with dialysis.[facility] [facility]  4.  Secondary hyperparathyroidism.  Lab Results  Component Value Date   CALCIUM 8.7 (L) 12/16/2020   CAION 1.17 11/07/2020   PHOS 1.6 (L) 12/16/2020  The patient has had intermittently low phosphorus which is required repletion.   LOS: 0 Eric Spence 3/18/20228:46 AM

## 2020-12-17 DIAGNOSIS — N186 End stage renal disease: Secondary | ICD-10-CM | POA: Diagnosis not present

## 2020-12-17 DIAGNOSIS — I469 Cardiac arrest, cause unspecified: Secondary | ICD-10-CM | POA: Diagnosis not present

## 2020-12-17 DIAGNOSIS — J9621 Acute and chronic respiratory failure with hypoxia: Secondary | ICD-10-CM | POA: Diagnosis not present

## 2020-12-17 DIAGNOSIS — I4891 Unspecified atrial fibrillation: Secondary | ICD-10-CM | POA: Diagnosis not present

## 2020-12-17 LAB — CBC
HCT: 26.8 % — ABNORMAL LOW (ref 39.0–52.0)
Hemoglobin: 8.2 g/dL — ABNORMAL LOW (ref 13.0–17.0)
MCH: 29.1 pg (ref 26.0–34.0)
MCHC: 30.6 g/dL (ref 30.0–36.0)
MCV: 95 fL (ref 80.0–100.0)
Platelets: 207 10*3/uL (ref 150–400)
RBC: 2.82 MIL/uL — ABNORMAL LOW (ref 4.22–5.81)
RDW: 18 % — ABNORMAL HIGH (ref 11.5–15.5)
WBC: 9.1 10*3/uL (ref 4.0–10.5)
nRBC: 0 % (ref 0.0–0.2)

## 2020-12-17 LAB — RENAL FUNCTION PANEL
Albumin: 2 g/dL — ABNORMAL LOW (ref 3.5–5.0)
Anion gap: 9 (ref 5–15)
BUN: 101 mg/dL — ABNORMAL HIGH (ref 8–23)
CO2: 27 mmol/L (ref 22–32)
Calcium: 8.5 mg/dL — ABNORMAL LOW (ref 8.9–10.3)
Chloride: 94 mmol/L — ABNORMAL LOW (ref 98–111)
Creatinine, Ser: 3.25 mg/dL — ABNORMAL HIGH (ref 0.61–1.24)
GFR, Estimated: 20 mL/min — ABNORMAL LOW (ref >60)
Glucose, Bld: 128 mg/dL — ABNORMAL HIGH (ref 70–99)
Phosphorus: 2.5 mg/dL (ref 2.5–4.6)
Potassium: 3.6 mmol/L (ref 3.5–5.1)
Sodium: 130 mmol/L — ABNORMAL LOW (ref 135–145)

## 2020-12-17 LAB — HEPATITIS B SURFACE ANTIBODY, QUANTITATIVE: Hep B S AB Quant (Post): 35.1 m[IU]/mL (ref >9.9)

## 2020-12-17 NOTE — Progress Notes (Signed)
Pulmonary Critical Care Medicine [place]   PULMONARY CRITICAL CARE SERVICE  PROGRESS NOTE  Date of Service: 12/17/2020  Eric Spence  HQI:[id number]  DOB: [birth year]   DOA: 11/21/2020  Referring Physician: Merton Border, MD  HPI: Eric Spence is a 65 y.o. male seen for follow up of Acute on Chronic Respiratory Failure.  Patient currently is on pressure support has been on 35% FiO2 with a pressure of 12/5  Medications: Reviewed on Rounds  Physical Exam:  Vitals: Temperature is 98.0 pulse 58 respiratory rate is 20 blood pressure is 148/73 saturations 100%  Ventilator Settings on pressure support FiO2 35%  . General: Comfortable at this time . Eyes: Grossly normal lids, irises & conjunctiva . ENT: grossly tongue is normal . Neck: no obvious mass . Cardiovascular: S1 S2 normal no gallop . Respiratory: No rhonchi no rales are noted at this time . Abdomen: soft . Skin: no rash seen on limited exam . Musculoskeletal: not rigid . Psychiatric:unable to assess . Neurologic: no seizure no involuntary movements         Lab Data:   Basic Metabolic Panel: Recent Labs  Lab 12/12/20 0524 12/14/20 0517 12/15/20 1002 12/16/20 0531 12/17/20 0706  NA 133* 133*  --  133* 130*  K 3.6 3.0* 3.5 3.5 3.6  CL 98 96*  --  97* 94*  CO2 28 28  --  28 27  GLUCOSE 122* 114*  --  123* 128*  BUN 97* 82*  --  77* 101*  CREATININE 3.30* 2.89*  --  2.75* 3.25*  CALCIUM 8.6* 8.5*  --  8.7* 8.5*  PHOS 1.7* 1.7*  --  1.6* 2.5    ABG: No results for input(s): PHART, PCO2ART, PO2ART, HCO3, O2SAT in the last 168 hours.  Liver Function Tests: Recent Labs  Lab 12/12/20 0524 12/14/20 0517 12/16/20 0531 12/17/20 0706  ALBUMIN 2.0* 1.8* 1.9* 2.0*   No results for input(s): LIPASE, AMYLASE in the last 168 hours. No results for input(s): AMMONIA in the last 168 hours.  CBC: Recent Labs  Lab 12/12/20 0524 12/14/20 0517 12/16/20 0531 12/17/20 0706  WBC 8.5 7.5 7.4  9.1  HGB 7.7* 7.5* 8.3* 8.2*  HCT 24.9* 24.4* 27.5* 26.8*  MCV 96.9 95.3 95.8 95.0  PLT 186 187 183 207    Cardiac Enzymes: No results for input(s): CKTOTAL, CKMB, CKMBINDEX, TROPONINI in the last 168 hours.  BNP (last 3 results) No results for input(s): BNP in the last 8760 hours.  ProBNP (last 3 results) No results for input(s): PROBNP in the last 8760 hours.  Radiological Exams: No results found.  Assessment/Plan Active Problems:   Acute on chronic respiratory failure with hypoxia (HCC)   Atrial fibrillation with rapid ventricular response (HCC)   End stage renal failure on dialysis [facility])   Cardiac arrest ([place])   Non-STEMI (non-ST elevated myocardial infarction) ([place])   1. Acute on chronic respiratory failure hypoxia we will continue with pressure support titrate oxygen continue pulmonary toilet. 2. Atrial fibrillation rate is controlled we will continue to follow 3. End-stage renal failure right now on hemodialysis 4. Cardiac arrest rhythm is stable 5. Non-STEMI no change   I have personally seen and evaluated the patient, evaluated laboratory and imaging results, formulated the assessment and plan and placed orders. The Patient requires high complexity decision making with multiple systems involvement.  Rounds were done with the Respiratory Therapy Director and Staff therapists and discussed with nursing staff also.  Hallee Mckenny A  Humphrey Rolls, MD [facility] Pulmonary Critical Care Medicine Sleep Medicine

## 2020-12-18 DIAGNOSIS — N186 End stage renal disease: Secondary | ICD-10-CM | POA: Diagnosis not present

## 2020-12-18 DIAGNOSIS — I4891 Unspecified atrial fibrillation: Secondary | ICD-10-CM | POA: Diagnosis not present

## 2020-12-18 DIAGNOSIS — I469 Cardiac arrest, cause unspecified: Secondary | ICD-10-CM | POA: Diagnosis not present

## 2020-12-18 DIAGNOSIS — J9621 Acute and chronic respiratory failure with hypoxia: Secondary | ICD-10-CM | POA: Diagnosis not present

## 2020-12-18 NOTE — Progress Notes (Signed)
Pulmonary Critical Care Medicine [place]   PULMONARY CRITICAL CARE SERVICE  PROGRESS NOTE  Date of Service: 12/18/2020  Eric Spence  HER:[id number]  DOB: [birth year]   DOA: 11/01/2020  Referring Physician: Merton Border, MD  HPI: Eric Spence is a 65 y.o. male seen for follow up of Acute on Chronic Respiratory Failure.  Patient currently is on pressure support has been on 30% FiO2.pressure 12/5  Medications: Reviewed on Rounds  Physical Exam:  Vitals: Temperature is 98.4 pulse 60 respiratory 20 blood pressure is 138/70 saturations 100%  Ventilator Settings on pressure support FiO2 30% pressure 12/5  . General: Comfortable at this time . Eyes: Grossly normal lids, irises & conjunctiva . ENT: grossly tongue is normal . Neck: no obvious mass . Cardiovascular: S1 S2 normal no gallop . Respiratory: No rhonchi very coarse breath sound . Abdomen: soft . Skin: no rash seen on limited exam . Musculoskeletal: not rigid . Psychiatric:unable to assess . Neurologic: no seizure no involuntary movements         Lab Data:   Basic Metabolic Panel: Recent Labs  Lab 12/12/20 0524 12/14/20 0517 12/15/20 1002 12/16/20 0531 12/17/20 0706  NA 133* 133*  --  133* 130*  K 3.6 3.0* 3.5 3.5 3.6  CL 98 96*  --  97* 94*  CO2 28 28  --  28 27  GLUCOSE 122* 114*  --  123* 128*  BUN 97* 82*  --  77* 101*  CREATININE 3.30* 2.89*  --  2.75* 3.25*  CALCIUM 8.6* 8.5*  --  8.7* 8.5*  PHOS 1.7* 1.7*  --  1.6* 2.5    ABG: No results for input(s): PHART, PCO2ART, PO2ART, HCO3, O2SAT in the last 168 hours.  Liver Function Tests: Recent Labs  Lab 12/12/20 0524 12/14/20 0517 12/16/20 0531 12/17/20 0706  ALBUMIN 2.0* 1.8* 1.9* 2.0*   No results for input(s): LIPASE, AMYLASE in the last 168 hours. No results for input(s): AMMONIA in the last 168 hours.  CBC: Recent Labs  Lab 12/12/20 0524 12/14/20 0517 12/16/20 0531 12/17/20 0706  WBC 8.5 7.5 7.4 9.1  HGB  7.7* 7.5* 8.3* 8.2*  HCT 24.9* 24.4* 27.5* 26.8*  MCV 96.9 95.3 95.8 95.0  PLT 186 187 183 207    Cardiac Enzymes: No results for input(s): CKTOTAL, CKMB, CKMBINDEX, TROPONINI in the last 168 hours.  BNP (last 3 results) No results for input(s): BNP in the last 8760 hours.  ProBNP (last 3 results) No results for input(s): PROBNP in the last 8760 hours.  Radiological Exams: No results found.  Assessment/Plan Active Problems:   Acute on chronic respiratory failure with hypoxia (HCC)   Atrial fibrillation with rapid ventricular response (HCC)   End stage renal failure on dialysis [facility])   Cardiac arrest ([place])   Non-STEMI (non-ST elevated myocardial infarction) ([place])   1. Acute on chronic respiratory failure hypoxia plan is to continue with the weaning on pressure support continue secretion management supportive care. 2. Atrial fibrillation rate is controlled we will continue to follow along. 3. End-stage renal failure on dialysis 4. Cardiac arrest rhythm stable 5. Non-STEMI no change we will continue to follow along.   I have personally seen and evaluated the patient, evaluated laboratory and imaging results, formulated the assessment and plan and placed orders. The Patient requires high complexity decision making with multiple systems involvement.  Rounds were done with the Respiratory Therapy Director and Staff therapists and discussed with nursing staff also.  Eric Spence  Eric Dopp, MD [facility] Pulmonary Critical Care Medicine Sleep Medicine

## 2020-12-19 DIAGNOSIS — N186 End stage renal disease: Secondary | ICD-10-CM | POA: Diagnosis not present

## 2020-12-19 DIAGNOSIS — I4891 Unspecified atrial fibrillation: Secondary | ICD-10-CM | POA: Diagnosis not present

## 2020-12-19 DIAGNOSIS — J9621 Acute and chronic respiratory failure with hypoxia: Secondary | ICD-10-CM | POA: Diagnosis not present

## 2020-12-19 DIAGNOSIS — I469 Cardiac arrest, cause unspecified: Secondary | ICD-10-CM | POA: Diagnosis not present

## 2020-12-19 LAB — RENAL FUNCTION PANEL
Albumin: 1.9 g/dL — ABNORMAL LOW (ref 3.5–5.0)
Anion gap: 8 (ref 5–15)
BUN: 64 mg/dL — ABNORMAL HIGH (ref 8–23)
CO2: 29 mmol/L (ref 22–32)
Calcium: 8.7 mg/dL — ABNORMAL LOW (ref 8.9–10.3)
Chloride: 96 mmol/L — ABNORMAL LOW (ref 98–111)
Creatinine, Ser: 2.98 mg/dL — ABNORMAL HIGH (ref 0.61–1.24)
GFR, Estimated: 23 mL/min — ABNORMAL LOW (ref >60)
Glucose, Bld: 116 mg/dL — ABNORMAL HIGH (ref 70–99)
Phosphorus: 2 mg/dL — ABNORMAL LOW (ref 2.5–4.6)
Potassium: 3.6 mmol/L (ref 3.5–5.1)
Sodium: 133 mmol/L — ABNORMAL LOW (ref 135–145)

## 2020-12-19 LAB — CBC
HCT: 27.3 % — ABNORMAL LOW (ref 39.0–52.0)
Hemoglobin: 8.2 g/dL — ABNORMAL LOW (ref 13.0–17.0)
MCH: 29.1 pg (ref 26.0–34.0)
MCHC: 30 g/dL (ref 30.0–36.0)
MCV: 96.8 fL (ref 80.0–100.0)
Platelets: 202 10*3/uL (ref 150–400)
RBC: 2.82 MIL/uL — ABNORMAL LOW (ref 4.22–5.81)
RDW: 17.8 % — ABNORMAL HIGH (ref 11.5–15.5)
WBC: 8.4 10*3/uL (ref 4.0–10.5)
nRBC: 0 % (ref 0.0–0.2)

## 2020-12-19 NOTE — Progress Notes (Signed)
[facility]  ROUNDING NOTE   Subjective:  Pt resting comfortably. Due for HD again today.    Objective:  Vital signs in last 24 hours:  Temperature 98 pulse 61 respirations 22 blood pressure 115/64   Physical Exam: General:  Chronically ill-appearing  Head:  Normocephalic, atraumatic. Moist oral mucosal membranes  Eyes:  Anicteric  Neck:  Tracheostomy in place  Lungs:   Clear to auscultation, normal effort  Heart:  S1S2 irregular  Abdomen:   Soft, nontender, bowel sounds present  Extremities:  Trace peripheral edema.  Neurologic:  Awake, alert, following commands  Skin:  No acute rash  Access:  Left upper extremity AV fistula    Basic Metabolic Panel: Recent Labs  Lab 12/14/20 0517 12/15/20 1002 12/16/20 0531 12/17/20 0706 12/19/20 0538  NA 133*  --  133* 130* 133*  K 3.0* 3.5 3.5 3.6 3.6  CL 96*  --  97* 94* 96*  CO2 28  --  28 27 29   GLUCOSE 114*  --  123* 128* 116*  BUN 82*  --  77* 101* 64*  CREATININE 2.89*  --  2.75* 3.25* 2.98*  CALCIUM 8.5*  --  8.7* 8.5* 8.7*  PHOS 1.7*  --  1.6* 2.5 2.0*    Liver Function Tests: Recent Labs  Lab 12/14/20 0517 12/16/20 0531 12/17/20 0706 12/19/20 0538  ALBUMIN 1.8* 1.9* 2.0* 1.9*   No results for input(s): LIPASE, AMYLASE in the last 168 hours. No results for input(s): AMMONIA in the last 168 hours.  CBC: Recent Labs  Lab 12/14/20 0517 12/16/20 0531 12/17/20 0706 12/19/20 0538  WBC 7.5 7.4 9.1 8.4  HGB 7.5* 8.3* 8.2* 8.2*  HCT 24.4* 27.5* 26.8* 27.3*  MCV 95.3 95.8 95.0 96.8  PLT 187 183 207 202    Cardiac Enzymes: No results for input(s): CKTOTAL, CKMB, CKMBINDEX, TROPONINI in the last 168 hours.  BNP: Invalid input(s): POCBNP  CBG: No results for input(s): GLUCAP in the last 168 hours.  Microbiology: Results for orders placed or performed during the hospital encounter of 11/27/2020  C Difficile Quick Screen (NO PCR Reflex)     Status: Abnormal   Collection Time: 11/09/20  9:59  AM   Specimen: STOOL  Result Value Ref Range Status   C Diff antigen POSITIVE (A) NEGATIVE Final   C Diff toxin NEGATIVE NEGATIVE Final   C Diff interpretation   Final    Results are indeterminate. Please contact the provider listed for your campus for C diff questions in [place].    Comment: Performed at [facility], [place] [address], [place], [place] [address]    Coagulation Studies: No results for input(s): LABPROT, INR in the last 72 hours.  Urinalysis: No results for input(s): COLORURINE, LABSPEC, PHURINE, GLUCOSEU, HGBUR, BILIRUBINUR, KETONESUR, PROTEINUR, UROBILINOGEN, NITRITE, LEUKOCYTESUR in the last 72 hours.  Invalid input(s): APPERANCEUR    Imaging: No results found.   Medications:       Assessment/ Plan:  65 y.o. 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  1.  ESRD on HD MWF.    Maintain pt on MWF dialysis schedule, due for HD today.   2.  Acute respiratory failure.  Remains on vent, Fio2  currently 30%.   3.  Anemia of chronic kidney disease.  Lab Results  Component Value Date   HGB 8.2 (L) 12/19/2020  continue retacrit 10000 units IV with HD.  4.  Secondary hyperparathyroidism.  Lab Results  Component Value Date   CALCIUM 8.7 (L) 12/19/2020   CAION 1.17 11/07/2020   PHOS 2.0 (L) 12/19/2020  The patient has had intermittently low phosphorus which is required repletion.   LOS: 0 Tydus Sanmiguel 3/21/20228:20 AM

## 2020-12-20 DIAGNOSIS — N186 End stage renal disease: Secondary | ICD-10-CM | POA: Diagnosis not present

## 2020-12-20 DIAGNOSIS — I469 Cardiac arrest, cause unspecified: Secondary | ICD-10-CM | POA: Diagnosis not present

## 2020-12-20 DIAGNOSIS — I4891 Unspecified atrial fibrillation: Secondary | ICD-10-CM | POA: Diagnosis not present

## 2020-12-20 DIAGNOSIS — J9621 Acute and chronic respiratory failure with hypoxia: Secondary | ICD-10-CM | POA: Diagnosis not present

## 2020-12-20 LAB — EXPECTORATED SPUTUM ASSESSMENT W GRAM STAIN, RFLX TO RESP C

## 2020-12-20 NOTE — Progress Notes (Signed)
Pulmonary Critical Care Medicine [place]   PULMONARY CRITICAL CARE SERVICE  PROGRESS NOTE  Date of Service: 12/20/2020   ANTHEM FRAZER  RWE:[id number]  DOB: 1956-03-29   DOA: 11/05/2020  Referring Physician: Merton Border, MD  HPI: TONY GRANQUIST is a 65 y.o. male seen for follow up of Acute on Chronic Respiratory Failure.  Patient is resting comfortably right now without distress no fevers are noted  Medications: Reviewed on Rounds  Physical Exam:  Vitals: Temperature is 98.2 pulse 56 respiratory 20 blood pressure is 127/66 saturations 100%  Ventilator Settings on T collar FiO2 40%  . General: Comfortable at this time . Eyes: Grossly normal lids, irises & conjunctiva . ENT: grossly tongue is normal . Neck: no obvious mass . Cardiovascular: S1 S2 normal no gallop . Respiratory: No rhonchi very coarse breath . Abdomen: soft . Skin: no rash seen on limited exam . Musculoskeletal: not rigid . Psychiatric:unable to assess . Neurologic: no seizure no involuntary movements         Lab Data:   Basic Metabolic Panel: Recent Labs  Lab 12/14/20 0517 12/15/20 1002 12/16/20 0531 12/17/20 0706 12/19/20 0538  NA 133*  --  133* 130* 133*  K 3.0* 3.5 3.5 3.6 3.6  CL 96*  --  97* 94* 96*  CO2 28  --  28 27 29   GLUCOSE 114*  --  123* 128* 116*  BUN 82*  --  77* 101* 64*  CREATININE 2.89*  --  2.75* 3.25* 2.98*  CALCIUM 8.5*  --  8.7* 8.5* 8.7*  PHOS 1.7*  --  1.6* 2.5 2.0*    ABG: No results for input(s): PHART, PCO2ART, PO2ART, HCO3, O2SAT in the last 168 hours.  Liver Function Tests: Recent Labs  Lab 12/14/20 0517 12/16/20 0531 12/17/20 0706 12/19/20 0538  ALBUMIN 1.8* 1.9* 2.0* 1.9*   No results for input(s): LIPASE, AMYLASE in the last 168 hours. No results for input(s): AMMONIA in the last 168 hours.  CBC: Recent Labs  Lab 12/14/20 0517 12/16/20 0531 12/17/20 0706 12/19/20 0538  WBC 7.5 7.4 9.1 8.4  HGB 7.5* 8.3* 8.2* 8.2*  HCT  24.4* 27.5* 26.8* 27.3*  MCV 95.3 95.8 95.0 96.8  PLT 187 183 207 202    Cardiac Enzymes: No results for input(s): CKTOTAL, CKMB, CKMBINDEX, TROPONINI in the last 168 hours.  BNP (last 3 results) No results for input(s): BNP in the last 8760 hours.  ProBNP (last 3 results) No results for input(s): PROBNP in the last 8760 hours.  Radiological Exams: No results found.  Assessment/Plan Active Problems:   Acute on chronic respiratory failure with hypoxia (HCC)   Atrial fibrillation with rapid ventricular response (HCC)   End stage renal failure on dialysis [facility])   Cardiac arrest ([place])   Non-STEMI (non-ST elevated myocardial infarction) ([place])   1. Acute on chronic respiratory failure hypoxia plan is to continue with the T collar patient is on 40% FiO2. 2. Atrial fibrillation rate controlled 3. End-stage renal failure on hemodialysis 4. Cardiac arrest rhythm is stable 5. Non-STEMI no change   I have personally seen and evaluated the patient, evaluated laboratory and imaging results, formulated the assessment and plan and placed orders. The Patient requires high complexity decision making with multiple systems involvement.  Rounds were done with the Respiratory Therapy Director and Staff therapists and discussed with nursing staff also.  [name], MD [facility] Pulmonary Critical Care Medicine Sleep Medicine

## 2020-12-20 NOTE — Progress Notes (Signed)
Pulmonary [place]   PULMONARY CRITICAL CARE SERVICE  PROGRESS NOTE    Eric Spence  OFB:[id number]  DOB: 03/02/1956   DOA: 11/13/2020  Referring Physician: Merton Border, MD  HPI: Eric Spence is a 65 y.o. male seen for follow up of Acute on Chronic Respiratory Failure.  Patient is pressure support has been on 30% FiO2 currently on a pressure of 12/5  Medications: Reviewed on Rounds  Physical Exam:  Vitals: Temperature is 98.4 pulse 60 respiratory 22 blood pressure is 115/64 saturations 99%  Ventilator Settings on pressure support FiO2 30% pressure 12/5  . General: Comfortable at this time . Eyes: Grossly normal lids, irises & conjunctiva . ENT: grossly tongue is normal . Neck: no obvious mass . Cardiovascular: S1 S2 normal no gallop . Respiratory: Scattered rhonchi expansion is equal . Abdomen: soft . Skin: no rash seen on limited exam . Musculoskeletal: not rigid . Psychiatric:unable to assess . Neurologic: no seizure no involuntary movements         Lab Data:   Basic Metabolic Panel: Recent Labs  Lab 12/14/20 0517 12/15/20 1002 12/16/20 0531 12/17/20 0706 12/19/20 0538  NA 133*  --  133* 130* 133*  K 3.0* 3.5 3.5 3.6 3.6  CL 96*  --  97* 94* 96*  CO2 28  --  28 27 29   GLUCOSE 114*  --  123* 128* 116*  BUN 82*  --  77* 101* 64*  CREATININE 2.89*  --  2.75* 3.25* 2.98*  CALCIUM 8.5*  --  8.7* 8.5* 8.7*  PHOS 1.7*  --  1.6* 2.5 2.0*    ABG: No results for input(s): PHART, PCO2ART, PO2ART, HCO3, O2SAT in the last 168 hours.  Liver Function Tests: Recent Labs  Lab 12/14/20 0517 12/16/20 0531 12/17/20 0706 12/19/20 0538  ALBUMIN 1.8* 1.9* 2.0* 1.9*   No results for input(s): LIPASE, AMYLASE in the last 168 hours. No results for input(s): AMMONIA in the last 168 hours.  CBC: Recent Labs  Lab 12/14/20 0517 12/16/20 0531 12/17/20 0706 12/19/20 0538  WBC 7.5 7.4 9.1 8.4  HGB 7.5* 8.3* 8.2* 8.2*   HCT 24.4* 27.5* 26.8* 27.3*  MCV 95.3 95.8 95.0 96.8  PLT 187 183 207 202    Cardiac Enzymes: No results for input(s): CKTOTAL, CKMB, CKMBINDEX, TROPONINI in the last 168 hours.  BNP (last 3 results) No results for input(s): BNP in the last 8760 hours.  ProBNP (last 3 results) No results for input(s): PROBNP in the last 8760 hours.  Radiological Exams: No results found.  Assessment/Plan Active Problems:   Acute on chronic respiratory failure with hypoxia (HCC)   Atrial fibrillation with rapid ventricular response (HCC)   End stage renal failure on dialysis [facility])   Cardiac arrest ([place])   Non-STEMI (non-ST elevated myocardial infarction) ([place])   1. Acute on chronic respiratory failure hypoxia plan is to continue with the weaning protocol as tolerated.  We will continue with secretion management 2. Atrial fibrillation rate controlled 3. End-stage renal failure on dialysis 4. Cardiac arrest rhythm stable 5. Non-STEMI no change overall we will continue to monitor closely   I have personally seen and evaluated the patient, evaluated laboratory and imaging results, formulated the assessment and plan and placed orders. The Patient requires high complexity decision making with multiple systems involvement.  Rounds were done with the Respiratory Therapy Director and Staff therapists and discussed with nursing staff also.  [name], MD [facility] Pulmonary  Critical Care Medicine Sleep Medicine

## 2020-12-21 DIAGNOSIS — I469 Cardiac arrest, cause unspecified: Secondary | ICD-10-CM | POA: Diagnosis not present

## 2020-12-21 DIAGNOSIS — I4891 Unspecified atrial fibrillation: Secondary | ICD-10-CM | POA: Diagnosis not present

## 2020-12-21 DIAGNOSIS — J9621 Acute and chronic respiratory failure with hypoxia: Secondary | ICD-10-CM | POA: Diagnosis not present

## 2020-12-21 DIAGNOSIS — N186 End stage renal disease: Secondary | ICD-10-CM | POA: Diagnosis not present

## 2020-12-21 LAB — CBC
HCT: 27.5 % — ABNORMAL LOW (ref 39.0–52.0)
Hemoglobin: 8.4 g/dL — ABNORMAL LOW (ref 13.0–17.0)
MCH: 29.4 pg (ref 26.0–34.0)
MCHC: 30.5 g/dL (ref 30.0–36.0)
MCV: 96.2 fL (ref 80.0–100.0)
Platelets: 216 10*3/uL (ref 150–400)
RBC: 2.86 MIL/uL — ABNORMAL LOW (ref 4.22–5.81)
RDW: 17.5 % — ABNORMAL HIGH (ref 11.5–15.5)
WBC: 8.4 10*3/uL (ref 4.0–10.5)
nRBC: 0 % (ref 0.0–0.2)

## 2020-12-21 LAB — RENAL FUNCTION PANEL
Albumin: 2 g/dL — ABNORMAL LOW (ref 3.5–5.0)
Anion gap: 6 (ref 5–15)
BUN: 57 mg/dL — ABNORMAL HIGH (ref 8–23)
CO2: 30 mmol/L (ref 22–32)
Calcium: 8.8 mg/dL — ABNORMAL LOW (ref 8.9–10.3)
Chloride: 96 mmol/L — ABNORMAL LOW (ref 98–111)
Creatinine, Ser: 2.82 mg/dL — ABNORMAL HIGH (ref 0.61–1.24)
GFR, Estimated: 24 mL/min — ABNORMAL LOW (ref >60)
Glucose, Bld: 139 mg/dL — ABNORMAL HIGH (ref 70–99)
Phosphorus: 2.1 mg/dL — ABNORMAL LOW (ref 2.5–4.6)
Potassium: 3.9 mmol/L (ref 3.5–5.1)
Sodium: 132 mmol/L — ABNORMAL LOW (ref 135–145)

## 2020-12-21 LAB — BLOOD GAS, ARTERIAL
Acid-Base Excess: 4.7 mmol/L — ABNORMAL HIGH (ref 0.0–2.0)
Bicarbonate: 30.2 mmol/L — ABNORMAL HIGH (ref 20.0–28.0)
FIO2: 40
O2 Saturation: 87.1 %
Patient temperature: 36.6
pCO2 arterial: 56.4 mmHg — ABNORMAL HIGH (ref 32.0–48.0)
pH, Arterial: 7.345 — ABNORMAL LOW (ref 7.350–7.450)
pO2, Arterial: 51.7 mmHg — ABNORMAL LOW (ref 83.0–108.0)

## 2020-12-21 NOTE — Progress Notes (Signed)
[facility]  ROUNDING NOTE   Subjective:  Pt seen at bedside.  Still on and off the vent.    Objective:  Vital signs in last 24 hours:  Temperature 97.2 pulse 63 respiration 34 blood pressure 126/51   Physical Exam: General:  Chronically ill-appearing  Head:  Normocephalic, atraumatic. Moist oral mucosal membranes  Eyes:  Anicteric  Neck:  Tracheostomy in place  Lungs:   Clear to auscultation, normal effort  Heart:  S1S2 irregular  Abdomen:   Soft, nontender, bowel sounds present  Extremities:  Trace peripheral edema.  Neurologic:  Awake, alert, following commands  Skin:  No acute rash  Access:  Left upper extremity AV fistula    Basic Metabolic Panel: Recent Labs  Lab 12/15/20 1002 12/16/20 0531 12/16/20 0531 12/17/20 0706 12/19/20 0538 12/21/20 0438  NA  --  133*  --  130* 133* 132*  K 3.5 3.5  --  3.6 3.6 3.9  CL  --  97*  --  94* 96* 96*  CO2  --  28  --  27 29 30   GLUCOSE  --  123*  --  128* 116* 139*  BUN  --  77*  --  101* 64* 57*  CREATININE  --  2.75*  --  3.25* 2.98* 2.82*  CALCIUM  --  8.7*   < > 8.5* 8.7* 8.8*  PHOS  --  1.6*  --  2.5 2.0* 2.1*   < > = values in this interval not displayed.    Liver Function Tests: Recent Labs  Lab 12/16/20 0531 12/17/20 0706 12/19/20 0538 12/21/20 0438  ALBUMIN 1.9* 2.0* 1.9* 2.0*   No results for input(s): LIPASE, AMYLASE in the last 168 hours. No results for input(s): AMMONIA in the last 168 hours.  CBC: Recent Labs  Lab 12/16/20 0531 12/17/20 0706 12/19/20 0538 12/21/20 0438  WBC 7.4 9.1 8.4 8.4  HGB 8.3* 8.2* 8.2* 8.4*  HCT 27.5* 26.8* 27.3* 27.5*  MCV 95.8 95.0 96.8 96.2  PLT 183 207 202 216    Cardiac Enzymes: No results for input(s): CKTOTAL, CKMB, CKMBINDEX, TROPONINI in the last 168 hours.  BNP: Invalid input(s): POCBNP  CBG: No results for input(s): GLUCAP in the last 168 hours.  Microbiology: Results for orders placed or performed during the hospital encounter of  11/22/2020  C Difficile Quick Screen (NO PCR Reflex)     Status: Abnormal   Collection Time: 11/09/20  9:59 AM   Specimen: STOOL  Result Value Ref Range Status   C Diff antigen POSITIVE (A) NEGATIVE Final   C Diff toxin NEGATIVE NEGATIVE Final   C Diff interpretation   Final    Results are indeterminate. Please contact the provider listed for your campus for C diff questions in [place].    Comment: Performed at [facility], [place] [address] [place], [place] [address]  Expectorated Sputum Assessment w Gram Stain, Rflx to Resp Cult     Status: None   Collection Time: 12/20/20  1:02 PM   Specimen: Tracheal Aspirate; Sputum  Result Value Ref Range Status   Specimen Description EXPECTORATED SPUTUM  Final   Special Requests NONE  Final   Sputum evaluation   Final    THIS SPECIMEN IS ACCEPTABLE FOR SPUTUM CULTURE Performed at [facility], [place] [address], [place], [place] [address]    Report Status 12/20/2020 FINAL  Final  Culture, Respiratory w Gram Stain     Status: None (Preliminary result)  Collection Time: 12/20/20  1:02 PM  Result Value Ref Range Status   Specimen Description EXPECTORATED SPUTUM  Final   Special Requests NONE Reflexed from [id number]  Final   Gram Stain   Final    RARE SQUAMOUS EPITHELIAL CELLS PRESENT ABUNDANT WBC PRESENT, PREDOMINANTLY PMN FEW GRAM POSITIVE RODS RARE GRAM POSITIVE COCCI Performed at [facility], [address] [place], [place] [address]    Culture PENDING  Incomplete   Report Status PENDING  Incomplete    Coagulation Studies: No results for input(s): LABPROT, INR in the last 72 hours.  Urinalysis: No results for input(s): COLORURINE, LABSPEC, PHURINE, GLUCOSEU, HGBUR, BILIRUBINUR, KETONESUR, PROTEINUR, UROBILINOGEN, NITRITE, LEUKOCYTESUR in the last 72 hours.  Invalid input(s): APPERANCEUR    Imaging: No results found.   Medications:       Assessment/ Plan:  65 y.o. 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  1.  ESRD on HD MWF.    Patient due for hemodialysis treatment today.  Orders have been prepared.  2.  Acute respiratory failure.  Patient continues to be on and off the ventilator.  Weaning protocol in place.  3.  Anemia of chronic kidney disease.  Lab Results  Component Value Date   HGB 8.4 (L) 12/21/2020  Most recent hemoglobin as above.  Maintain the patient on Retacrit 10,000 units with each dialysis session.  4.  Secondary hyperparathyroidism.  Lab Results  Component Value Date   CALCIUM 8.8 (L) 12/21/2020   CAION 1.17 11/12/2020   PHOS 2.1 (L) 12/21/2020  Phosphorus up to 2.1 post sodium phosphorus infusion.   LOS: 0 Eric Spence 3/23/20228:52 AM

## 2020-12-21 NOTE — Progress Notes (Signed)
Pulmonary Critical Care Medicine [place]   PULMONARY CRITICAL CARE SERVICE  PROGRESS NOTE  Date of Service: 12/21/2020   Eric Spence  WGN:[id number]  DOB: 06-26-1956   DOA: 11/22/2020  Referring Physician: Merton Border, MD  HPI: Eric Spence is a 65 y.o. male seen for follow up of Acute on Chronic Respiratory Failure.  Patient is resting comfortably right now without distress has been on T collar with 40% FiO2  Medications: Reviewed on Rounds  Physical Exam:  Vitals: Temperature is 97.8 pulse 63 respiratory 30 blood pressure is 126/51 saturations 95%  Ventilator Settings currently off the ventilator on T collar FiO2 40%  . General: Comfortable at this time . Eyes: Grossly normal lids, irises & conjunctiva . ENT: grossly tongue is normal . Neck: no obvious mass . Cardiovascular: S1 S2 normal no gallop . Respiratory: No rhonchi very coarse breath sounds . Abdomen: soft . Skin: no rash seen on limited exam . Musculoskeletal: not rigid . Psychiatric:unable to assess . Neurologic: no seizure no involuntary movements         Lab Data:   Basic Metabolic Panel: Recent Labs  Lab 12/15/20 1002 12/16/20 0531 12/17/20 0706 12/19/20 0538 12/21/20 0438  NA  --  133* 130* 133* 132*  K 3.5 3.5 3.6 3.6 3.9  CL  --  97* 94* 96* 96*  CO2  --  28 27 29 30   GLUCOSE  --  123* 128* 116* 139*  BUN  --  77* 101* 64* 57*  CREATININE  --  2.75* 3.25* 2.98* 2.82*  CALCIUM  --  8.7* 8.5* 8.7* 8.8*  PHOS  --  1.6* 2.5 2.0* 2.1*    ABG: No results for input(s): PHART, PCO2ART, PO2ART, HCO3, O2SAT in the last 168 hours.  Liver Function Tests: Recent Labs  Lab 12/16/20 0531 12/17/20 0706 12/19/20 0538 12/21/20 0438  ALBUMIN 1.9* 2.0* 1.9* 2.0*   No results for input(s): LIPASE, AMYLASE in the last 168 hours. No results for input(s): AMMONIA in the last 168 hours.  CBC: Recent Labs  Lab 12/16/20 0531 12/17/20 0706 12/19/20 0538 12/21/20 0438  WBC  7.4 9.1 8.4 8.4  HGB 8.3* 8.2* 8.2* 8.4*  HCT 27.5* 26.8* 27.3* 27.5*  MCV 95.8 95.0 96.8 96.2  PLT 183 207 202 216    Cardiac Enzymes: No results for input(s): CKTOTAL, CKMB, CKMBINDEX, TROPONINI in the last 168 hours.  BNP (last 3 results) No results for input(s): BNP in the last 8760 hours.  ProBNP (last 3 results) No results for input(s): PROBNP in the last 8760 hours.  Radiological Exams: No results found.  Assessment/Plan Active Problems:   Acute on chronic respiratory failure with hypoxia (HCC)   Atrial fibrillation with rapid ventricular response (HCC)   End stage renal failure on dialysis [facility])   Cardiac arrest ([place])   Non-STEMI (non-ST elevated myocardial infarction) ([place])   1. Acute on chronic respiratory failure hypoxia plan is continue to continue with T collar trials titrate oxygen continue pulmonary toilet. 2. Atrial fibrillation rate is controlled 3. End-stage renal failure on hemodialysis 4. Cardiac arrest rhythm has been stable. 5. Non-STEMI patient is at baseline   I have personally seen and evaluated the patient, evaluated laboratory and imaging results, formulated the assessment and plan and placed orders. The Patient requires high complexity decision making with multiple systems involvement.  Rounds were done with the Respiratory Therapy Director and Staff therapists and discussed with nursing staff also.  [name],  MD [facility] Pulmonary Critical Care Medicine Sleep Medicine

## 2020-12-22 DIAGNOSIS — N186 End stage renal disease: Secondary | ICD-10-CM | POA: Diagnosis not present

## 2020-12-22 DIAGNOSIS — I469 Cardiac arrest, cause unspecified: Secondary | ICD-10-CM | POA: Diagnosis not present

## 2020-12-22 DIAGNOSIS — I4891 Unspecified atrial fibrillation: Secondary | ICD-10-CM | POA: Diagnosis not present

## 2020-12-22 DIAGNOSIS — J9621 Acute and chronic respiratory failure with hypoxia: Secondary | ICD-10-CM | POA: Diagnosis not present

## 2020-12-22 LAB — CULTURE, RESPIRATORY W GRAM STAIN

## 2020-12-23 DIAGNOSIS — I4891 Unspecified atrial fibrillation: Secondary | ICD-10-CM | POA: Diagnosis not present

## 2020-12-23 DIAGNOSIS — N186 End stage renal disease: Secondary | ICD-10-CM | POA: Diagnosis not present

## 2020-12-23 DIAGNOSIS — I469 Cardiac arrest, cause unspecified: Secondary | ICD-10-CM | POA: Diagnosis not present

## 2020-12-23 DIAGNOSIS — J9621 Acute and chronic respiratory failure with hypoxia: Secondary | ICD-10-CM | POA: Diagnosis not present

## 2020-12-23 LAB — RENAL FUNCTION PANEL
Albumin: 2.1 g/dL — ABNORMAL LOW (ref 3.5–5.0)
Anion gap: 7 (ref 5–15)
BUN: 77 mg/dL — ABNORMAL HIGH (ref 8–23)
CO2: 31 mmol/L (ref 22–32)
Calcium: 9.1 mg/dL (ref 8.9–10.3)
Chloride: 93 mmol/L — ABNORMAL LOW (ref 98–111)
Creatinine, Ser: 2.71 mg/dL — ABNORMAL HIGH (ref 0.61–1.24)
GFR, Estimated: 25 mL/min — ABNORMAL LOW (ref >60)
Glucose, Bld: 141 mg/dL — ABNORMAL HIGH (ref 70–99)
Phosphorus: 2.2 mg/dL — ABNORMAL LOW (ref 2.5–4.6)
Potassium: 4.5 mmol/L (ref 3.5–5.1)
Sodium: 131 mmol/L — ABNORMAL LOW (ref 135–145)

## 2020-12-23 LAB — CBC
HCT: 30 % — ABNORMAL LOW (ref 39.0–52.0)
Hemoglobin: 8.7 g/dL — ABNORMAL LOW (ref 13.0–17.0)
MCH: 28.6 pg (ref 26.0–34.0)
MCHC: 29 g/dL — ABNORMAL LOW (ref 30.0–36.0)
MCV: 98.7 fL (ref 80.0–100.0)
Platelets: 247 10*3/uL (ref 150–400)
RBC: 3.04 MIL/uL — ABNORMAL LOW (ref 4.22–5.81)
RDW: 17.3 % — ABNORMAL HIGH (ref 11.5–15.5)
WBC: 10.8 10*3/uL — ABNORMAL HIGH (ref 4.0–10.5)
nRBC: 0 % (ref 0.0–0.2)

## 2020-12-23 NOTE — Progress Notes (Signed)
Pulmonary [place]   PULMONARY CRITICAL CARE SERVICE  PROGRESS NOTE     Eric Spence  XBJ:[id number]  DOB: [birth year]   DOA: 11/25/2020  Referring Physician: Merton Border, MD  HPI: Eric Spence is a 65 y.o. male seen for follow up of Acute on Chronic Respiratory Failure.  Afebrile right now comfortable without distress has been off the ventilator on T collar  Medications: Reviewed on Rounds  Physical Exam:  Vitals: Temperature is 97.9 pulse 70 respiratory 30 blood pressure is 160/87 saturations 97%  Ventilator Settings off the ventilator on T collar at this time  . General: Comfortable at this time . Eyes: Grossly normal lids, irises & conjunctiva . ENT: grossly tongue is normal . Neck: no obvious mass . Cardiovascular: S1 S2 normal no gallop . Respiratory: No rhonchi very coarse breath sounds . Abdomen: soft . Skin: no rash seen on limited exam . Musculoskeletal: not rigid . Psychiatric:unable to assess . Neurologic: no seizure no involuntary movements         Lab Data:   Basic Metabolic Panel: Recent Labs  Lab 12/17/20 0706 12/19/20 0538 12/21/20 0438 12/23/20 0608  NA 130* 133* 132* 131*  K 3.6 3.6 3.9 4.5  CL 94* 96* 96* 93*  CO2 27 29 30 31   GLUCOSE 128* 116* 139* 141*  BUN 101* 64* 57* 77*  CREATININE 3.25* 2.98* 2.82* 2.71*  CALCIUM 8.5* 8.7* 8.8* 9.1  PHOS 2.5 2.0* 2.1* 2.2*    ABG: Recent Labs  Lab 12/21/20 1804  PHART 7.345*  PCO2ART 56.4*  PO2ART 51.7*  HCO3 30.2*  O2SAT 87.1    Liver Function Tests: Recent Labs  Lab 12/17/20 0706 12/19/20 0538 12/21/20 0438 12/23/20 0608  ALBUMIN 2.0* 1.9* 2.0* 2.1*   No results for input(s): LIPASE, AMYLASE in the last 168 hours. No results for input(s): AMMONIA in the last 168 hours.  CBC: Recent Labs  Lab 12/17/20 0706 12/19/20 0538 12/21/20 0438 12/23/20 0608  WBC 9.1 8.4 8.4 10.8*  HGB 8.2* 8.2* 8.4* 8.7*  HCT 26.8* 27.3* 27.5*  30.0*  MCV 95.0 96.8 96.2 98.7  PLT 207 202 216 247    Cardiac Enzymes: No results for input(s): CKTOTAL, CKMB, CKMBINDEX, TROPONINI in the last 168 hours.  BNP (last 3 results) No results for input(s): BNP in the last 8760 hours.  ProBNP (last 3 results) No results for input(s): PROBNP in the last 8760 hours.  Radiological Exams: No results found.  Assessment/Plan Active Problems:   Acute on chronic respiratory failure with hypoxia (HCC)   Atrial fibrillation with rapid ventricular response (HCC)   End stage renal failure on dialysis [facility])   Cardiac arrest ([place])   Non-STEMI (non-ST elevated myocardial infarction) ([place])   1. Acute on chronic respiratory failure hypoxia we will continue with T-piece on 40% FiO2 continue pulmonary toilet 2. Atrial fibrillation rate is controlled 3. End-stage renal failure on hemodialysis 4. Cardiac arrest rhythm stable 5. Non-STEMI no change we will continue to monitor   I have personally seen and evaluated the patient, evaluated laboratory and imaging results, formulated the assessment and plan and placed orders. The Patient requires high complexity decision making with multiple systems involvement.  Rounds were done with the Respiratory Therapy Director and Staff therapists and discussed with nursing staff also.  [name], MD [facility] Pulmonary Critical Care Medicine Sleep Medicine

## 2020-12-23 NOTE — Progress Notes (Signed)
Pulmonary Critical Care Medicine [place]   PULMONARY CRITICAL CARE SERVICE  PROGRESS NOTE  Date of Service: 12/23/2020   Eric Spence  BLT:[id number]  DOB: [birth year]   DOA: 11/20/2020  Referring Physician: Merton Border, MD  HPI: Eric Spence is a 65 y.o. male seen for follow up of Acute on Chronic Respiratory Failure.  Patient is afebrile right now has been off the ventilator on T collar.  Medications: Reviewed on Rounds  Physical Exam:  Vitals: Temperature is 97.0 pulse 55 respiratory rate 28 blood pressure 109/56 saturations 100%  Ventilator Settings off the ventilator on T collar FiO2 of 50%   General: Comfortable at this time  Eyes: Grossly normal lids, irises & conjunctiva  ENT: grossly tongue is normal  Neck: no obvious mass  Cardiovascular: S1 S2 normal no gallop  Respiratory: Scattered rhonchi expansion is equal  Abdomen: soft  Skin: no rash seen on limited exam  Musculoskeletal: not rigid  Psychiatric:unable to assess  Neurologic: no seizure no involuntary movements         Lab Data:   Basic Metabolic Panel: Recent Labs  Lab 12/17/20 0706 12/19/20 0538 12/21/20 0438 12/23/20 0608  NA 130* 133* 132* 131*  K 3.6 3.6 3.9 4.5  CL 94* 96* 96* 93*  CO2 27 29 30 31   GLUCOSE 128* 116* 139* 141*  BUN 101* 64* 57* 77*  CREATININE 3.25* 2.98* 2.82* 2.71*  CALCIUM 8.5* 8.7* 8.8* 9.1  PHOS 2.5 2.0* 2.1* 2.2*    ABG: Recent Labs  Lab 12/21/20 1804  PHART 7.345*  PCO2ART 56.4*  PO2ART 51.7*  HCO3 30.2*  O2SAT 87.1    Liver Function Tests: Recent Labs  Lab 12/17/20 0706 12/19/20 0538 12/21/20 0438 12/23/20 0608  ALBUMIN 2.0* 1.9* 2.0* 2.1*   No results for input(s): LIPASE, AMYLASE in the last 168 hours. No results for input(s): AMMONIA in the last 168 hours.  CBC: Recent Labs  Lab 12/17/20 0706 12/19/20 0538 12/21/20 0438 12/23/20 0608  WBC 9.1 8.4 8.4 10.8*  HGB 8.2* 8.2* 8.4* 8.7*  HCT 26.8* 27.3*  27.5* 30.0*  MCV 95.0 96.8 96.2 98.7  PLT 207 202 216 247    Cardiac Enzymes: No results for input(s): CKTOTAL, CKMB, CKMBINDEX, TROPONINI in the last 168 hours.  BNP (last 3 results) No results for input(s): BNP in the last 8760 hours.  ProBNP (last 3 results) No results for input(s): PROBNP in the last 8760 hours.  Radiological Exams: No results found.  Assessment/Plan Active Problems:   Acute on chronic respiratory failure with hypoxia (HCC)   Atrial fibrillation with rapid ventricular response (HCC)   End stage renal failure on dialysis [facility])   Cardiac arrest ([place])   Non-STEMI (non-ST elevated myocardial infarction) ([place])   1. Acute on chronic respiratory failure hypoxia we will continue with T-piece titrate oxygen down as tolerated. 2. Atrial fibrillation rate controlled 3. End-stage renal failure on hemodialysis 4. Cardiac arrest rhythm is stable 5. Non-STEMI no change we will continue to monitor closely.   I have personally seen and evaluated the patient, evaluated laboratory and imaging results, formulated the assessment and plan and placed orders. The Patient requires high complexity decision making with multiple systems involvement.  Rounds were done with the Respiratory Therapy Director and Staff therapists and discussed with nursing staff also.  [name], MD [facility] Pulmonary Critical Care Medicine Sleep Medicine

## 2020-12-23 NOTE — Progress Notes (Signed)
[facility]  ROUNDING NOTE   Subjective:  Patient seen and evaluated this AM. Currently on aerosolized trach collar. Due for hemodialysis later this AM.   Objective:  Vital signs in last 24 hours:  Temperature 97 pulse 55 respirations 20 blood pressure 109/56   Physical Exam: General:  Chronically ill-appearing  Head:  Normocephalic, atraumatic. Moist oral mucosal membranes  Eyes:  Anicteric  Neck:  Tracheostomy in place  Lungs:   Clear to auscultation, normal effort  Heart:  S1S2 irregular  Abdomen:   Soft, nontender, bowel sounds present  Extremities:  Trace peripheral edema.  Neurologic:  Lethargic but arousable  Skin:  No acute rash  Access:  Left upper extremity AV fistula    Basic Metabolic Panel: Recent Labs  Lab 12/17/20 0706 12/19/20 0538 12/21/20 0438 12/23/20 0608  NA 130* 133* 132* 131*  K 3.6 3.6 3.9 4.5  CL 94* 96* 96* 93*  CO2 27 29 30 31   GLUCOSE 128* 116* 139* 141*  BUN 101* 64* 57* 77*  CREATININE 3.25* 2.98* 2.82* 2.71*  CALCIUM 8.5* 8.7* 8.8* 9.1  PHOS 2.5 2.0* 2.1* 2.2*    Liver Function Tests: Recent Labs  Lab 12/17/20 0706 12/19/20 0538 12/21/20 0438 12/23/20 0608  ALBUMIN 2.0* 1.9* 2.0* 2.1*   No results for input(s): LIPASE, AMYLASE in the last 168 hours. No results for input(s): AMMONIA in the last 168 hours.  CBC: Recent Labs  Lab 12/17/20 0706 12/19/20 0538 12/21/20 0438 12/23/20 0608  WBC 9.1 8.4 8.4 10.8*  HGB 8.2* 8.2* 8.4* 8.7*  HCT 26.8* 27.3* 27.5* 30.0*  MCV 95.0 96.8 96.2 98.7  PLT 207 202 216 247    Cardiac Enzymes: No results for input(s): CKTOTAL, CKMB, CKMBINDEX, TROPONINI in the last 168 hours.  BNP: Invalid input(s): POCBNP  CBG: No results for input(s): GLUCAP in the last 168 hours.  Microbiology: Results for orders placed or performed during the hospital encounter of 11/15/2020  C Difficile Quick Screen (NO PCR Reflex)     Status: Abnormal   Collection Time: 11/09/20  9:59 AM    Specimen: STOOL  Result Value Ref Range Status   C Diff antigen POSITIVE (A) NEGATIVE Final   C Diff toxin NEGATIVE NEGATIVE Final   C Diff interpretation   Final    Results are indeterminate. Please contact the provider listed for your campus for C diff questions in [place].    Comment: Performed at [facility], [place] [address], [place], [place] [address]  Expectorated Sputum Assessment w Gram Stain, Rflx to Resp Cult     Status: None   Collection Time: 12/20/20  1:02 PM   Specimen: Tracheal Aspirate; Sputum  Result Value Ref Range Status   Specimen Description EXPECTORATED SPUTUM  Final   Special Requests NONE  Final   Sputum evaluation   Final    THIS SPECIMEN IS ACCEPTABLE FOR SPUTUM CULTURE Performed at [facility], [place] [address] [place], [place] [address]    Report Status 12/20/2020 FINAL  Final  Culture, Respiratory w Gram Stain     Status: None   Collection Time: 12/20/20  1:02 PM  Result Value Ref Range Status   Specimen Description EXPECTORATED SPUTUM  Final   Special Requests NONE Reflexed from [id number]  Final   Gram Stain   Final    RARE SQUAMOUS EPITHELIAL CELLS PRESENT ABUNDANT WBC PRESENT, PREDOMINANTLY PMN FEW GRAM POSITIVE RODS RARE GRAM POSITIVE COCCI    Culture   Final  MODERATE MORAXELLA CATARRHALIS(BRANHAMELLA) BETA LACTAMASE POSITIVE Performed at [facility], [place] [address], [place], [place] [address]    Report Status 12/22/2020 FINAL  Final    Coagulation Studies: No results for input(s): LABPROT, INR in the last 72 hours.  Urinalysis: No results for input(s): COLORURINE, LABSPEC, PHURINE, GLUCOSEU, HGBUR, BILIRUBINUR, KETONESUR, PROTEINUR, UROBILINOGEN, NITRITE, LEUKOCYTESUR in the last 72 hours.  Invalid input(s): APPERANCEUR    Imaging: No results found.   Medications:       Assessment/ Plan:  65 y.o. male  with a PMHx of recent acute cardiopulmonary arrest on 09/27/2020, NSTEMI, severe sepsis from recurrent  pneumonia, atrial fibrillation, coronary artery disease with history of MI, diabetes mellitus type 2, anemia of chronic kidney disease, secondary hyperparathyroidism, ESRD with left upper extremity AV fistula, who was admitted to [facility] on 10/31/2020 for ongoing treatment.  1.  ESRD on HD MWF.    Patient will be due for hemodialysis treatment later today.  Orders have been prepared.  2.  Acute respiratory failure.  Patient currently off of the ventilator and has aerosolized trach collar in place.  No acute respiratory distress.  3.  Anemia of chronic kidney disease.  Lab Results  Component Value Date   HGB 8.7 (L) 12/23/2020  Continue Retacrit 10,000 units IV with dialysis.  4.  Secondary hyperparathyroidism.  Lab Results  Component Value Date   CALCIUM 9.1 12/23/2020   CAION 1.17 11/26/2020   PHOS 2.2 (L) 12/23/2020  Phosphorus has stabilized a bit and currently 2.2.   LOS: 0 Conn Trombetta 3/25/20228:14 AM

## 2020-12-30 DEATH — deceased

## 2022-02-26 IMAGING — CT CT ABD-PELV W/O CM
2 of 4 series · 15 of 46 positions shown, 17 images · non-contrast
Comparison: One-view abdomen 11/04/2020

CLINICAL DATA: Nausea and vomiting.  Question obstruction.

EXAM:
CT ABDOMEN AND PELVIS WITHOUT CONTRAST
TECHNIQUE: Multidetector CT imaging of the abdomen and pelvis was performed
following the standard protocol without IV contrast.

[Series 3: ap without · axial · non-contrast · 0.98mm/px · z∈[+1116,+1566]mm · 12 of 102 slices shown, 14 images]
[im 6/102  soft-tissue]
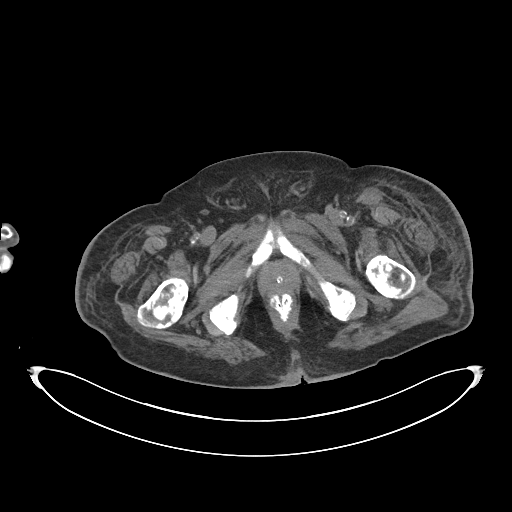
[im 6/102  bone]
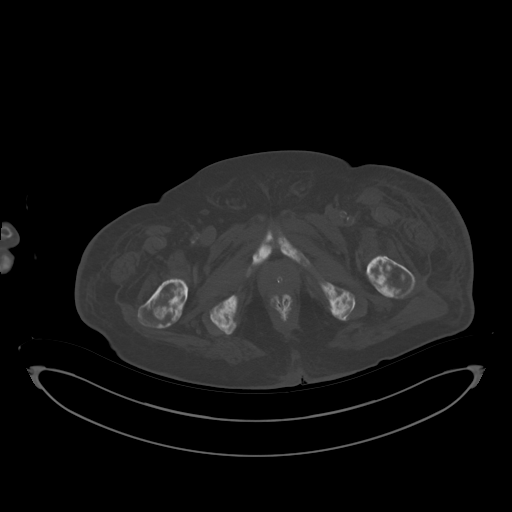
[im 17/102  soft-tissue]
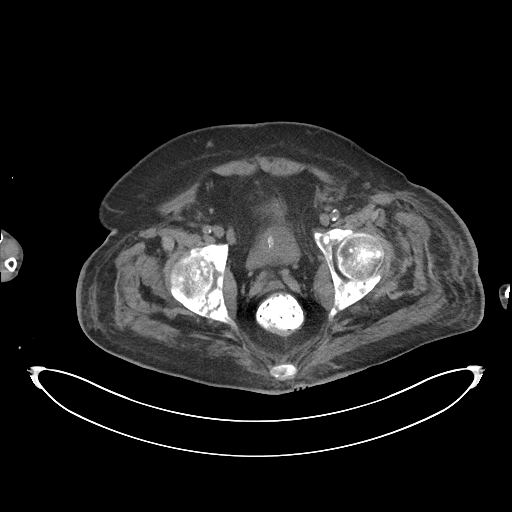
[im 23/102  soft-tissue]
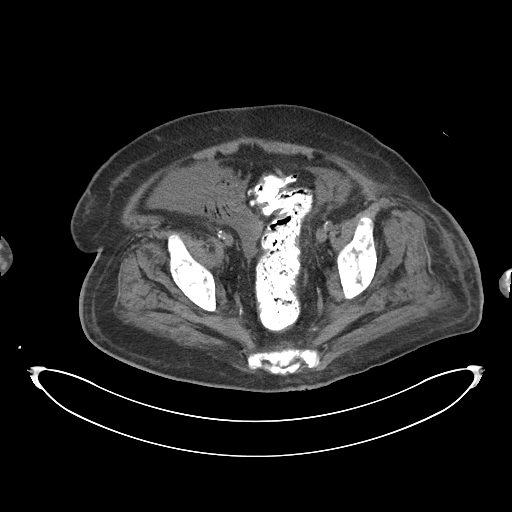
[im 29/102  soft-tissue]
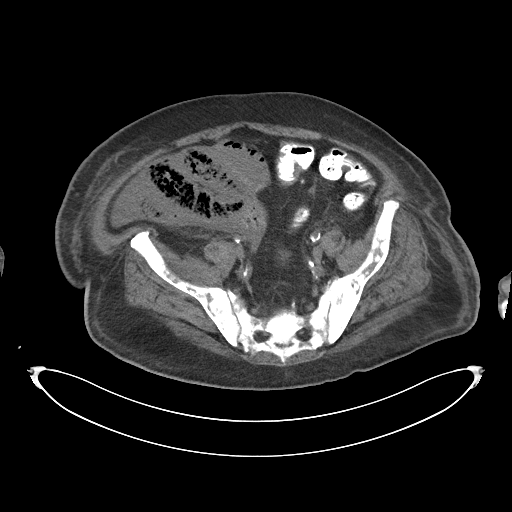
[im 40/102  soft-tissue]
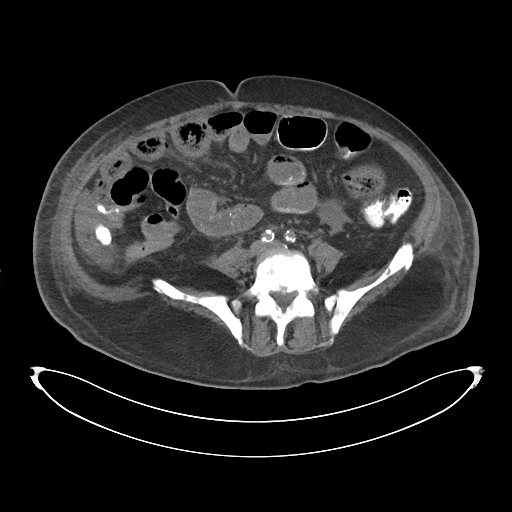
[im 45/102  soft-tissue]
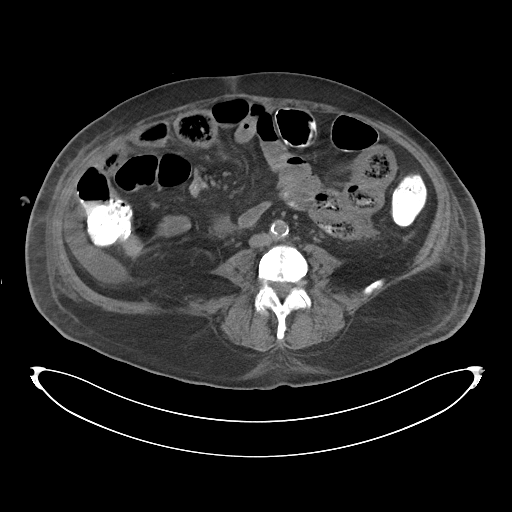
[im 57/102  soft-tissue]
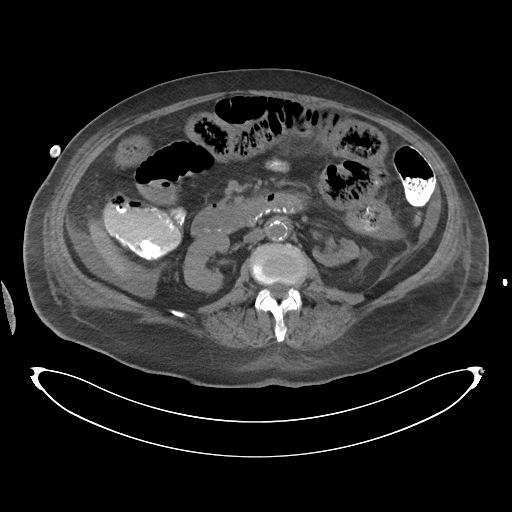
[im 62/102  soft-tissue]
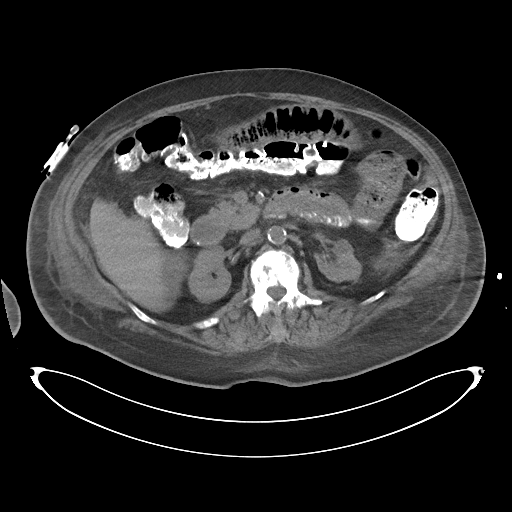
[im 73/102  soft-tissue]
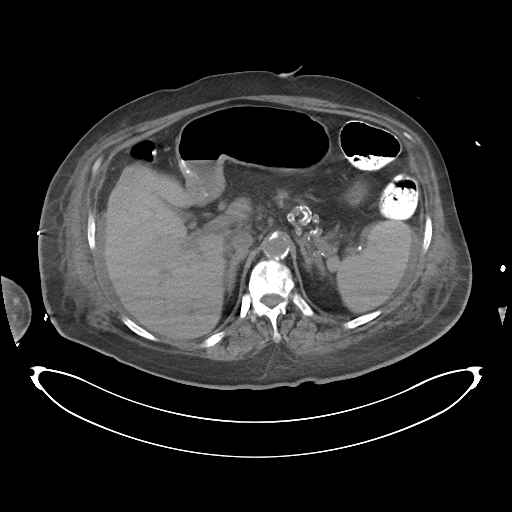
[im 73/102  bone]
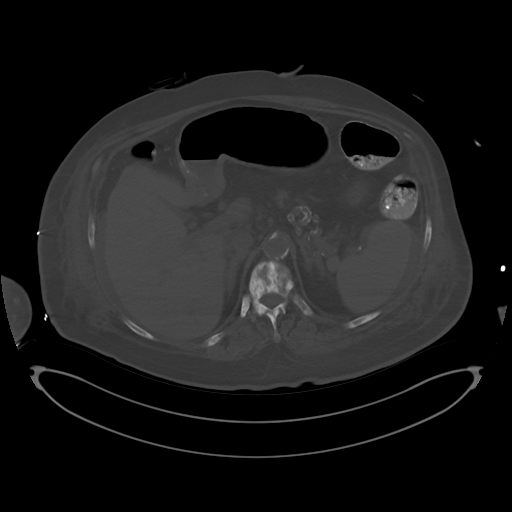
[im 79/102  soft-tissue]
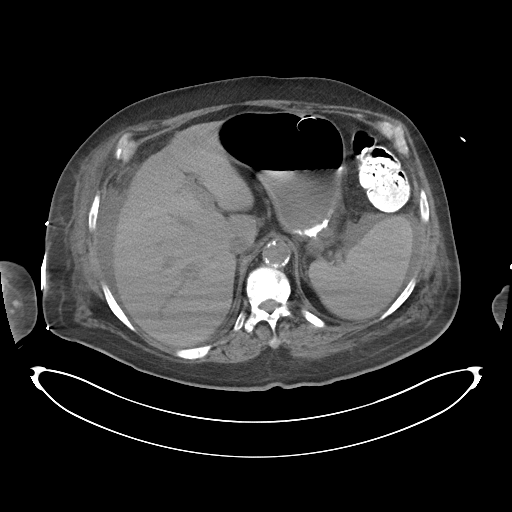
[im 85/102  soft-tissue]
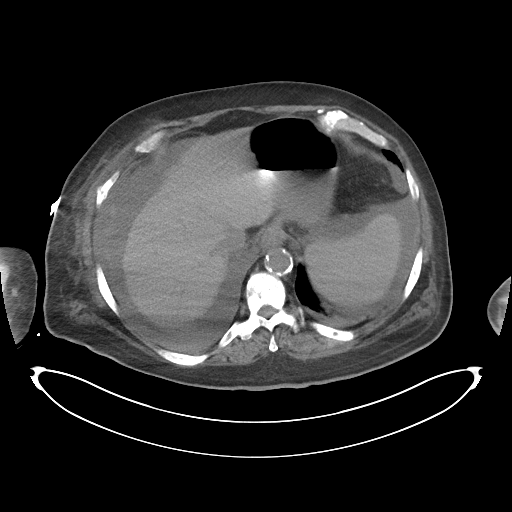
[im 96/102  soft-tissue]
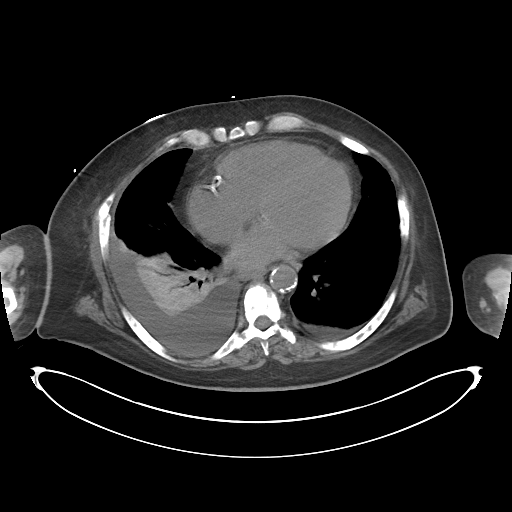

[Series 6: cor · coronal · 0.99mm/px · 3 of 116 slices shown]
[im 39/116  soft-tissue]
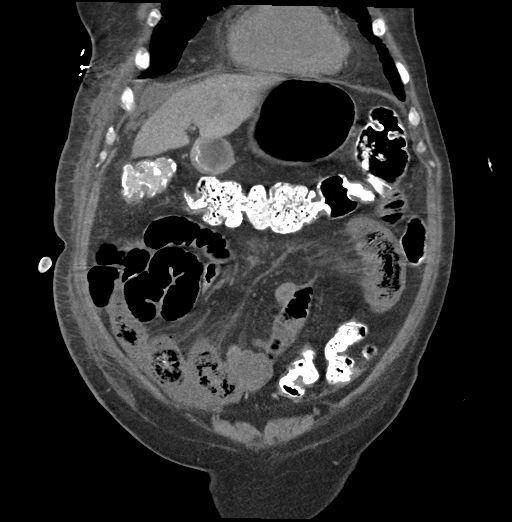
[im 52/116  soft-tissue]
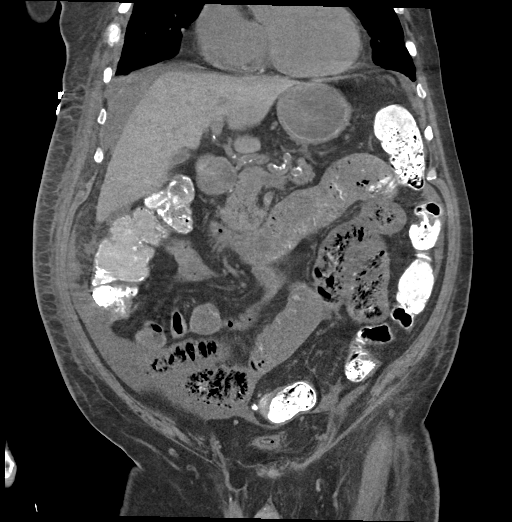
[im 64/116  soft-tissue]
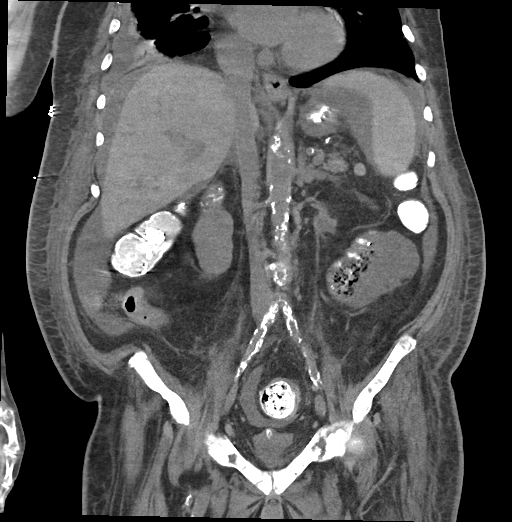

[15 of 46 positions shown; findings below may reference images not displayed]

FINDINGS: Lower chest: Heart size is mildly enlarged. Coronary artery
calcifications are present. No significant pericardial effusion is
present. Moderate right and small left pleural effusions are
present. There is partial collapse of the right lower lobe. No
airway obstruction is present. Mild dependent atelectasis is present
in the left lung.

Hepatobiliary: Liver is unremarkable. Common bile duct and
gallbladder are within normal limits.

Pancreas: Unremarkable. No pancreatic ductal dilatation or
surrounding inflammatory changes.

Spleen: Normal in size without focal abnormality.

Adrenals/Urinary Tract: Adrenal glands are normal bilaterally. Left
kidney is atrophic with marked cortical thinning.

17 mm exophytic lesion near the lower pole right kidney likely
represents a cyst. No other focal renal lesions are present on the
right. Vascular calcifications are present without other stones.
Collecting system is within normal limits. Ureters are unremarkable.
Foley catheter is present within the urinary bladder.

Stomach/Bowel: Stomach is unremarkable. Contrast is noted
dependently within the proximal small bowel. Areas of pneumatosis
are noted proximally. Extensive proximal small bowel wall thickening
is present with pneumatosis in stranding of the mesentery. Distal
small bowel loops are not thickened. Contrast is seen throughout the
colon. There is some diverticular change in the sigmoid colon
without inflammation.

Vascular/Lymphatic: Extensive atherosclerotic calcifications are
present in the aorta and branch vessel. Extraluminal soft tissue
prominence is noted in the infrarenal abdominal aorta with
measurement 29 mm. There is no focal stranding about the aorta at
this level. There is some irregularity just below this is well.
Dense calcifications extend into the iliac arteries bilaterally with
suspected iliac artery stenosis. [place] of the celiac and SMA have
dense calcifications but appear patent.

Reproductive: Prostate is unremarkable.

Other: Diffuse abdominal ascites are present. Largest collection is
in the right pericolic gutter. Diffuse mesenteric stranding is
associated with the presumed ischemic bowel.

Musculoskeletal: Diffuse sclerotic changes are present throughout
the visualized skeleton. Vertebral body heights are maintained. No
pathologic fractures are present.
IMPRESSION: 1. Extensive proximal small bowel wall thickening, pneumatosis, and
stranding of the mesentery compatible with ischemic bowel. This
involves the proximal [DATE] of the small bowel. Distal small bowel
appears more normal. Although there is no definite proximal
stenosis, this may be the sequela of more distal mesenteric stenosis
or emboli.
2. Extraluminal soft tissue prominence in the infrarenal abdominal
aorta with measurement of 29 mm. This is concerning for focal
aortitis. Acute abnormality is considered unlikely without focal
stranding.
3. Moderate right and small left pleural effusions with partial
collapse of the right lower lobe.
4. Diffuse sclerotic changes throughout the visualized skeleton
compatible with metastatic disease. Question prostate cancer.
5. Atrophic left kidney with marked cortical thinning. This is
likely ischemic.
6. Aortic Atherosclerosis (GQMAX-FJ8.8).
